# Patient Record
Sex: Male | Born: 1985 | Race: White | Hispanic: No | Marital: Married | Smoking: Never smoker
Health system: Southern US, Community
[De-identification: ages and names within clinical notes are randomized; demographics above are authoritative.]

## PROBLEM LIST (undated history)

## (undated) DIAGNOSIS — K863 Pseudocyst of pancreas: Secondary | ICD-10-CM

## (undated) DIAGNOSIS — K8020 Calculus of gallbladder without cholecystitis without obstruction: Secondary | ICD-10-CM

## (undated) HISTORY — DX: Pseudocyst of pancreas: K86.3

## (undated) HISTORY — DX: Calculus of gallbladder without cholecystitis without obstruction: K80.20

---

## 2017-09-08 ENCOUNTER — Encounter: Payer: Self-pay | Admitting: Gastroenterology

## 2017-10-30 ENCOUNTER — Ambulatory Visit: Payer: Self-pay | Admitting: Gastroenterology

## 2017-10-30 ENCOUNTER — Other Ambulatory Visit: Payer: Self-pay

## 2019-03-25 ENCOUNTER — Encounter (HOSPITAL_COMMUNITY): Payer: Self-pay | Admitting: *Deleted

## 2019-03-25 ENCOUNTER — Inpatient Hospital Stay (HOSPITAL_COMMUNITY)
Admission: EM | Admit: 2019-03-25 | Discharge: 2019-03-30 | DRG: 417 | Disposition: A | Discharge status: Home / Self Care | Payer: Commercial Managed Care - PPO | Attending: Internal Medicine | Admitting: Internal Medicine

## 2019-03-25 ENCOUNTER — Emergency Department (HOSPITAL_COMMUNITY): Payer: Commercial Managed Care - PPO

## 2019-03-25 ENCOUNTER — Other Ambulatory Visit: Payer: Self-pay

## 2019-03-25 DIAGNOSIS — R1013 Epigastric pain: Secondary | ICD-10-CM | POA: Diagnosis not present

## 2019-03-25 DIAGNOSIS — K8689 Other specified diseases of pancreas: Secondary | ICD-10-CM | POA: Diagnosis present

## 2019-03-25 DIAGNOSIS — Z6835 Body mass index (BMI) 35.0-35.9, adult: Secondary | ICD-10-CM

## 2019-03-25 DIAGNOSIS — K8020 Calculus of gallbladder without cholecystitis without obstruction: Principal | ICD-10-CM | POA: Diagnosis present

## 2019-03-25 DIAGNOSIS — Z1159 Encounter for screening for other viral diseases: Secondary | ICD-10-CM

## 2019-03-25 DIAGNOSIS — K851 Biliary acute pancreatitis without necrosis or infection: Secondary | ICD-10-CM | POA: Diagnosis present

## 2019-03-25 DIAGNOSIS — K8050 Calculus of bile duct without cholangitis or cholecystitis without obstruction: Secondary | ICD-10-CM | POA: Diagnosis present

## 2019-03-25 DIAGNOSIS — R109 Unspecified abdominal pain: Secondary | ICD-10-CM

## 2019-03-25 DIAGNOSIS — R748 Abnormal levels of other serum enzymes: Secondary | ICD-10-CM | POA: Diagnosis present

## 2019-03-25 DIAGNOSIS — Z114 Encounter for screening for human immunodeficiency virus [HIV]: Secondary | ICD-10-CM

## 2019-03-25 DIAGNOSIS — E669 Obesity, unspecified: Secondary | ICD-10-CM | POA: Diagnosis present

## 2019-03-25 LAB — COMPREHENSIVE METABOLIC PANEL
ALT: 663 U/L — ABNORMAL HIGH (ref 0–44)
AST: 281 U/L — ABNORMAL HIGH (ref 15–41)
Albumin: 4.7 g/dL (ref 3.5–5.0)
Alkaline Phosphatase: 253 U/L — ABNORMAL HIGH (ref 38–126)
Anion gap: 12 (ref 5–15)
BUN: 12 mg/dL (ref 6–20)
CO2: 27 mmol/L (ref 22–32)
Calcium: 9.7 mg/dL (ref 8.9–10.3)
Chloride: 102 mmol/L (ref 98–111)
Creatinine, Ser: 0.93 mg/dL (ref 0.61–1.24)
GFR calc Af Amer: >60 mL/min (ref >60)
GFR calc non Af Amer: >60 mL/min (ref >60)
Glucose, Bld: 183 mg/dL — ABNORMAL HIGH (ref 70–99)
Potassium: 4.4 mmol/L (ref 3.5–5.1)
Sodium: 141 mmol/L (ref 135–145)
Total Bilirubin: 5.8 mg/dL — ABNORMAL HIGH (ref 0.3–1.2)
Total Protein: 8.3 g/dL — ABNORMAL HIGH (ref 6.5–8.1)

## 2019-03-25 LAB — CBC
HCT: 47.6 % (ref 39.0–52.0)
Hemoglobin: 15.4 g/dL (ref 13.0–17.0)
MCH: 30.8 pg (ref 26.0–34.0)
MCHC: 32.4 g/dL (ref 30.0–36.0)
MCV: 95.2 fL (ref 80.0–100.0)
Platelets: 250 10*3/uL (ref 150–400)
RBC: 5 MIL/uL (ref 4.22–5.81)
RDW: 12.7 % (ref 11.5–15.5)
WBC: 15.9 10*3/uL — ABNORMAL HIGH (ref 4.0–10.5)
nRBC: 0 % (ref 0.0–0.2)

## 2019-03-25 MED ORDER — HYDROMORPHONE HCL 1 MG/ML IJ SOLN
0.5000 mg | Freq: Once | INTRAMUSCULAR | Status: AC
  Administered 2019-03-25: 0.5 mg via INTRAVENOUS
  Filled 2019-03-25: qty 1

## 2019-03-25 MED ORDER — METOCLOPRAMIDE HCL 5 MG/ML IJ SOLN
10.0000 mg | Freq: Once | INTRAMUSCULAR | Status: AC
  Administered 2019-03-25: 10 mg via INTRAVENOUS
  Filled 2019-03-25: qty 2

## 2019-03-25 MED ORDER — SODIUM CHLORIDE 0.9% FLUSH
3.0000 mL | Freq: Once | INTRAVENOUS | Status: AC
  Administered 2019-03-25: 3 mL via INTRAVENOUS

## 2019-03-25 MED ORDER — SODIUM CHLORIDE 0.9 % IV BOLUS
1000.0000 mL | Freq: Once | INTRAVENOUS | Status: AC
  Administered 2019-03-25: 1000 mL via INTRAVENOUS

## 2019-03-25 MED ORDER — HYDROMORPHONE HCL 1 MG/ML IJ SOLN
1.0000 mg | Freq: Once | INTRAMUSCULAR | Status: AC
  Administered 2019-03-25: 23:00:00 1 mg via INTRAVENOUS
  Filled 2019-03-25: qty 1

## 2019-03-25 MED ORDER — ONDANSETRON HCL 4 MG/2ML IJ SOLN
4.0000 mg | Freq: Once | INTRAMUSCULAR | Status: AC
  Administered 2019-03-25: 4 mg via INTRAVENOUS
  Filled 2019-03-25: qty 2

## 2019-03-25 NOTE — ED Triage Notes (Signed)
Pt bib EMS and presents with abd pain and has hx of gall stones.  Pt last ate at 16:00 and then at 17:00, pt began to vomit blood and diffuse abd pain. Pt is also in the process of getting worked up for gall stones.

## 2019-03-25 NOTE — ED Provider Notes (Signed)
[facility]-EMERGENCY DEPT Provider Note   CSN: [id number] Arrival date & time: 03/25/19  2048    History   Chief Complaint Chief Complaint  Patient presents with  . Abdominal Pain  . Hematemesis    HPI Benjamin Gregory is a 33 y.o. male.     Patient with no significant medical history presents with upper abdominal pain associated with nausea and vomiting. Symptoms started this afternoon. He reports this is similar to previous episodes of pain and vomiting he has been experiencing intermittently over the past 2 years. The pain is usually postprandial. It has been associated with fever in the past but no fever today. No urinary symptoms, chest pain, SOB, cough. He states his emesis today appeared to have blood.  The history is provided by the patient. No language interpreter was used.  Abdominal Pain  Associated symptoms: nausea and vomiting   Associated symptoms: no chest pain, no chills, no cough, no diarrhea, no fever and no shortness of breath     History reviewed. No pertinent past medical history.  There are no active problems to display for this patient.   History reviewed. No pertinent surgical history.      Home Medications    Prior to Admission medications   Not on File    Family History No family history on file.  Social History Social History   Tobacco Use  . Smoking status: Never Smoker  . Smokeless tobacco: Never Used  Substance Use Topics  . Alcohol use: Yes    Alcohol/week: 4.0 standard drinks    Types: 4 Standard drinks or equivalent per week  . Drug use: Never     Allergies   Patient has no known allergies.   Review of Systems Review of Systems  Constitutional: Negative for chills and fever.  HENT: Negative.   Respiratory: Negative.  Negative for cough and shortness of breath.   Cardiovascular: Negative.  Negative for chest pain.  Gastrointestinal: Positive for abdominal pain, nausea and vomiting. Negative for  diarrhea.       Hematemesis.  Genitourinary: Negative.   Musculoskeletal: Negative.  Negative for myalgias.  Skin: Negative.   Neurological: Negative.  Negative for syncope.     Physical Exam Updated Vital Signs BP 99/66 (BP Location: Left Arm)   Pulse (!) 55   Temp 98 F (36.7 C) (Oral)   Resp 18   Ht 5\' 11"  (1.803 m)   Wt 115.7 kg   SpO2 100%   BMI 35.57 kg/m   Physical Exam Vitals signs and nursing note reviewed.  Constitutional:      Appearance: He is obese. He is ill-appearing.  Cardiovascular:     Rate and Rhythm: Normal rate and regular rhythm.     Heart sounds: No murmur.  Pulmonary:     Effort: Pulmonary effort is normal.     Breath sounds: No wheezing, rhonchi or rales.  Abdominal:     Tenderness: There is abdominal tenderness. There is guarding.     Comments: Tender across upper abdomen, worst in the epigastric region. There is guarding.  Skin:    General: Skin is warm and dry.  Neurological:     General: No focal deficit present.     Mental Status: He is alert.      ED Treatments / Results  Labs (all labs ordered are listed, but only abnormal results are displayed) Labs Reviewed  COMPREHENSIVE METABOLIC PANEL - Abnormal; Notable for the following components:  Result Value   Glucose, Bld 183 (*)    Total Protein 8.3 (*)    AST 281 (*)    ALT 663 (*)    Alkaline Phosphatase 253 (*)    Total Bilirubin 5.8 (*)    All other components within normal limits  CBC - Abnormal; Notable for the following components:   WBC 15.9 (*)    All other components within normal limits  LIPASE, BLOOD  URINALYSIS, ROUTINE W REFLEX MICROSCOPIC    EKG None  Radiology No results found.  Procedures Procedures (including critical care time)  Medications Ordered in ED Medications  sodium chloride flush (NS) 0.9 % injection 3 mL (has no administration in time range)  sodium chloride 0.9 % bolus 1,000 mL (has no administration in time range)  ondansetron  (ZOFRAN) injection 4 mg (has no administration in time range)  HYDROmorphone (DILAUDID) injection 1 mg (has no administration in time range)     Initial Impression / Assessment and Plan / ED Course  I have reviewed the triage vital signs and the nursing notes.  Pertinent labs & imaging results that were available during my care of the patient were reviewed by me and considered in my medical decision making (see chart for details).       Patient to ED with abdominal pain, vomiting, being evaluated for gall stones in the outpatient setting.   On arrival he is uncomfortable and ill appearing with looking toxic. He requires multiple doses of medications to gain any relief of pain and nausea.   Labs show significantly elevated transaminases with total bili 5.3. Lipase >2600.   US shows gall stones without cholecystitis. There is dilated CBD with Caseres present. Work c/w choledocholithiasis and gall Belmares pancreatitis. Discussed with Dr. Loney Loh, [facility], who requests GI consultation.   Discussed with Dr. Adela Lank who advises that a ERCP would be done, unknown when procedure will take place. He will see the patient in the morning. [facility] to admit.   Patient updated with all information.   Final Clinical Impressions(s) / ED Diagnoses   Final diagnoses:  None   1. Choledocholithiases 2. Gall Lewers pancreatitis   ED Discharge Orders    None       Danne Harbor 03/26/19 5625    Tilden Fossa, MD 03/26/19 [phone]

## 2019-03-26 ENCOUNTER — Encounter (HOSPITAL_COMMUNITY): Payer: Self-pay | Admitting: Internal Medicine

## 2019-03-26 ENCOUNTER — Observation Stay (HOSPITAL_COMMUNITY): Payer: Commercial Managed Care - PPO

## 2019-03-26 DIAGNOSIS — K8050 Calculus of bile duct without cholangitis or cholecystitis without obstruction: Secondary | ICD-10-CM

## 2019-03-26 DIAGNOSIS — K851 Biliary acute pancreatitis without necrosis or infection: Secondary | ICD-10-CM

## 2019-03-26 DIAGNOSIS — Z114 Encounter for screening for human immunodeficiency virus [HIV]: Secondary | ICD-10-CM

## 2019-03-26 LAB — CBC WITH DIFFERENTIAL/PLATELET
Abs Immature Granulocytes: 0.06 10*3/uL (ref 0.00–0.07)
Basophils Absolute: 0 10*3/uL (ref 0.0–0.1)
Basophils Relative: 0 %
Eosinophils Absolute: 0 10*3/uL (ref 0.0–0.5)
Eosinophils Relative: 0 %
HCT: 48.5 % (ref 39.0–52.0)
Hemoglobin: 15.7 g/dL (ref 13.0–17.0)
Immature Granulocytes: 0 %
Lymphocytes Relative: 3 %
Lymphs Abs: 0.5 10*3/uL — ABNORMAL LOW (ref 0.7–4.0)
MCH: 30.8 pg (ref 26.0–34.0)
MCHC: 32.4 g/dL (ref 30.0–36.0)
MCV: 95.1 fL (ref 80.0–100.0)
Monocytes Absolute: 0.5 10*3/uL (ref 0.1–1.0)
Monocytes Relative: 3 %
Neutro Abs: 15.3 10*3/uL — ABNORMAL HIGH (ref 1.7–7.7)
Neutrophils Relative %: 94 %
Platelets: 257 10*3/uL (ref 150–400)
RBC: 5.1 MIL/uL (ref 4.22–5.81)
RDW: 12.7 % (ref 11.5–15.5)
WBC: 16.4 10*3/uL — ABNORMAL HIGH (ref 4.0–10.5)
nRBC: 0 % (ref 0.0–0.2)

## 2019-03-26 LAB — URINALYSIS, ROUTINE W REFLEX MICROSCOPIC
Glucose, UA: 50 mg/dL — AB
Hgb urine dipstick: NEGATIVE
Ketones, ur: 20 mg/dL — AB
Leukocytes,Ua: NEGATIVE
Nitrite: NEGATIVE
Protein, ur: 30 mg/dL — AB
Specific Gravity, Urine: 1.032 — ABNORMAL HIGH (ref 1.005–1.030)
pH: 5 (ref 5.0–8.0)

## 2019-03-26 LAB — LIPASE, BLOOD: Lipase: 2679 U/L — ABNORMAL HIGH (ref 11–51)

## 2019-03-26 LAB — HEPATIC FUNCTION PANEL
ALT: 575 U/L — ABNORMAL HIGH (ref 0–44)
AST: 235 U/L — ABNORMAL HIGH (ref 15–41)
Albumin: 3.9 g/dL (ref 3.5–5.0)
Alkaline Phosphatase: 230 U/L — ABNORMAL HIGH (ref 38–126)
Bilirubin, Direct: 1.9 mg/dL — ABNORMAL HIGH (ref 0.0–0.2)
Indirect Bilirubin: 1.2 mg/dL — ABNORMAL HIGH (ref 0.3–0.9)
Total Bilirubin: 3.1 mg/dL — ABNORMAL HIGH (ref 0.3–1.2)
Total Protein: 7.3 g/dL (ref 6.5–8.1)

## 2019-03-26 LAB — SARS CORONAVIRUS 2 BY RT PCR (HOSPITAL ORDER, PERFORMED IN ~~LOC~~ HOSPITAL LAB): SARS Coronavirus 2: NEGATIVE

## 2019-03-26 MED ORDER — HYDROMORPHONE HCL 1 MG/ML IJ SOLN
0.5000 mg | Freq: Once | INTRAMUSCULAR | Status: AC
  Administered 2019-03-26: 10:00:00 0.5 mg via INTRAVENOUS
  Filled 2019-03-26: qty 0.5

## 2019-03-26 MED ORDER — PIPERACILLIN-TAZOBACTAM 3.375 G IVPB
3.3750 g | Freq: Three times a day (TID) | INTRAVENOUS | Status: DC
  Administered 2019-03-26 – 2019-03-28 (×6): 3.375 g via INTRAVENOUS
  Filled 2019-03-26 (×7): qty 50

## 2019-03-26 MED ORDER — SODIUM CHLORIDE 0.9 % IV SOLN
INTRAVENOUS | Status: DC
  Administered 2019-03-26 – 2019-03-27 (×3): via INTRAVENOUS

## 2019-03-26 MED ORDER — ACETAMINOPHEN 325 MG PO TABS
650.0000 mg | ORAL_TABLET | Freq: Four times a day (QID) | ORAL | Status: DC | PRN
  Administered 2019-03-27 (×2): 650 mg via ORAL
  Filled 2019-03-26 (×2): qty 2

## 2019-03-26 MED ORDER — SODIUM CHLORIDE 0.9 % IV BOLUS: 2000.0000 mL | Freq: Once | INTRAVENOUS | Status: DC

## 2019-03-26 MED ORDER — ONDANSETRON HCL 4 MG/2ML IJ SOLN
4.0000 mg | Freq: Four times a day (QID) | INTRAMUSCULAR | Status: DC | PRN
  Administered 2019-03-26 – 2019-03-30 (×11): 4 mg via INTRAVENOUS
  Filled 2019-03-26 (×10): qty 2

## 2019-03-26 MED ORDER — HEPARIN SODIUM (PORCINE) 5000 UNIT/ML IJ SOLN: 5000.0000 [IU] | Freq: Three times a day (TID) | INTRAMUSCULAR | Status: DC

## 2019-03-26 MED ORDER — LACTATED RINGERS IV SOLN
INTRAVENOUS | Status: DC
  Administered 2019-03-26 (×2): via INTRAVENOUS

## 2019-03-26 MED ORDER — HYDROMORPHONE HCL 1 MG/ML IJ SOLN
0.5000 mg | Freq: Once | INTRAMUSCULAR | Status: AC
  Administered 2019-03-26: 0.5 mg via INTRAVENOUS
  Filled 2019-03-26: qty 0.5

## 2019-03-26 MED ORDER — HYDROMORPHONE HCL 1 MG/ML IJ SOLN
1.0000 mg | INTRAMUSCULAR | Status: DC | PRN
  Administered 2019-03-26 – 2019-03-29 (×24): 1 mg via INTRAVENOUS
  Filled 2019-03-26 (×25): qty 1

## 2019-03-26 MED ORDER — GADOBUTROL 1 MMOL/ML IV SOLN
10.0000 mL | Freq: Once | INTRAVENOUS | Status: AC | PRN
  Administered 2019-03-26: 10 mL via INTRAVENOUS

## 2019-03-26 MED ORDER — HYDROMORPHONE HCL 1 MG/ML IJ SOLN
1.0000 mg | Freq: Once | INTRAMUSCULAR | Status: AC
  Administered 2019-03-26: 01:00:00 1 mg via INTRAVENOUS
  Filled 2019-03-26: qty 1

## 2019-03-26 MED ORDER — PIPERACILLIN-TAZOBACTAM 3.375 G IVPB 30 MIN
3.3750 g | Freq: Once | INTRAVENOUS | Status: AC
  Administered 2019-03-26: 03:00:00 3.375 g via INTRAVENOUS
  Filled 2019-03-26: qty 50

## 2019-03-26 MED ORDER — METRONIDAZOLE IN NACL 5-0.79 MG/ML-% IV SOLN
500.0000 mg | Freq: Three times a day (TID) | INTRAVENOUS | Status: DC
  Administered 2019-03-26 (×2): 500 mg via INTRAVENOUS
  Filled 2019-03-26 (×2): qty 100

## 2019-03-26 MED ORDER — SODIUM CHLORIDE 0.9 % IV SOLN: 2.0000 g | INTRAVENOUS | Status: DC

## 2019-03-26 MED ORDER — SODIUM CHLORIDE 0.9 % IV SOLN
2.0000 g | Freq: Once | INTRAVENOUS | Status: AC
  Administered 2019-03-26: 2 g via INTRAVENOUS
  Filled 2019-03-26: qty 20

## 2019-03-26 MED ORDER — PANTOPRAZOLE SODIUM 40 MG IV SOLR
40.0000 mg | INTRAVENOUS | Status: DC
  Administered 2019-03-26 – 2019-03-28 (×3): 40 mg via INTRAVENOUS
  Filled 2019-03-26 (×3): qty 40

## 2019-03-26 MED ORDER — ACETAMINOPHEN 650 MG RE SUPP: 650.0000 mg | Freq: Four times a day (QID) | RECTAL | Status: DC | PRN

## 2019-03-26 NOTE — Consult Note (Signed)
Referring Provider: No ref. provider found Primary Care Physician:  Patient, No Pcp Per Primary Gastroenterologist:  Dr. Althia Forts   Reason for Consultation: gallstone pancreatitis   HPI: Benjamin Gregory is a 33 y.o. male who reports having intermittent episodes of gallstone attacks for the past 2 years. However, he has never been evaluated for gallstones. He reports having episodes of significant epigastric pain and nausea which occur after eating fatty foods approximately once every few months. These episodes occur during the day, evening and sometimes awakens him from sleep. He developed moderate epigastric pain which radiated to the mid back with associated nausea 1 week ago. He also noticed his urine was darker in color. His upper abdominal pain and nausea persisted and he contacted his PCP on Wednesday 6/3 and was prescribed Nexium and Pepto-Bismol. On Friday 6/5 he vomited bilious type secretions, second episode of vomiting was described as a cup of dark red blood with secretions and later vomited a third time consisting of secretions without obvious blood. He doesn't usually vomit with episodes of epigastric pain. He did not take Pepto-Bismol yesterday. No heartburn or dysphagia. His bowel movements have been normal formed and brown. No melena or rectal bleeding.  He denies taking NSAIDs. He drinks 4-6 beers on the weekends maybe once monthly.  He denies excessive alcohol intake.  No illicit drug use.  Family history of upper or lower GI cancer or gallstones. He intentionally lost 30 lbs over the past year with increased exercise.  ED course 03/25/2019: Sodium 141.  Potassium 4.4.  Glucose 183.  BUN 12.  Creatinine 0.93.  Alk phos 253.  Lipase 2679.  AST 281.  ALT 663.  WBC 15.9.  Hemoglobin 15.4.  Hematocrit 47.6.  Platelets 250.  SARS-CoV-2 negative.  03/26/2019: Alk phos 230.  Albumin 3.9.  AST 235.  ALT 575.  Direct bili 1.9.  Indirect bili 1.2.  Total bili 3.1.  WBC 16.4.  Hemoglobin 15.7.   Hematocrit 48.5. PLT 257.  Right upper quadrant abdominal sonogram: Cholelithiasis without signs of acute cholecystitis, dilated common bile duct suspicious for underlying choledocholithiasis, diffuse increased echogenicity with slight heterogeneous liver likely fatty infiltration. Fibrosis secondary consideration.  As of cirrhosis.  The portal vein is patent.    Prior to Admission medications   Medication Sig Start Date End Date Taking? Authorizing Provider  OVER THE COUNTER MEDICATION Take 1 tablet by mouth once.   Yes [provider]    Current Facility-Administered Medications  Medication Dose Route Frequency Provider Last Rate Last Dose  . acetaminophen (TYLENOL) tablet 650 mg  650 mg Oral Q6H PRN Shela Leff, MD       Or  . acetaminophen (TYLENOL) suppository 650 mg  650 mg Rectal Q6H PRN Shela Leff, MD      . cefTRIAXone (ROCEPHIN) 2 g in sodium chloride 0.9 % 100 mL IVPB  2 g Intravenous Q24H Shela Leff, MD      . HYDROmorphone (DILAUDID) injection 1 mg  1 mg Intravenous Q3H PRN Shela Leff, MD   1 mg at 03/26/19 0814  . lactated ringers infusion   Intravenous Continuous Shela Leff, MD 500 mL/hr at 03/26/19 [phone]    . metroNIDAZOLE (FLAGYL) IVPB 500 mg  500 mg Intravenous Q8H Shela Leff, MD 100 mL/hr at 03/26/19 0400    . ondansetron (ZOFRAN) injection 4 mg  4 mg Intravenous Q6H PRN Shela Leff, MD   4 mg at 03/26/19 5537    Allergies as of 03/25/2019  . (No  Known Allergies)    Family History  Problem Relation Age of Onset  . Hypertension Father     Social History   Socioeconomic History  . Marital status: Single    Spouse name: Not on file  . Number of children: Not on file  . Years of education: Not on file  . Highest education level: Not on file  Occupational History  . Not on file  Social Needs  . Financial resource strain: Not on file  . Food insecurity:    Worry: Not on file    Inability: Not on  file  . Transportation needs:    Medical: Not on file    Non-medical: Not on file  Tobacco Use  . Smoking status: Never Smoker  . Smokeless tobacco: Never Used  Substance and Sexual Activity  . Alcohol use: Yes    Alcohol/week: 4.0 standard drinks    Types: 4 Standard drinks or equivalent per week  . Drug use: Never  . Sexual activity: Not on file  Lifestyle  . Physical activity:    Days per week: Not on file    Minutes per session: Not on file  . Stress: Not on file  Relationships  . Social connections:    Talks on phone: Not on file    Gets together: Not on file    Attends religious service: Not on file    Active member of club or organization: Not on file    Attends meetings of clubs or organizations: Not on file    Relationship status: Not on file  . Intimate partner violence:    Fear of current or ex partner: Not on file    Emotionally abused: Not on file    Physically abused: Not on file    Forced sexual activity: Not on file  Other Topics Concern  . Not on file  Social History Narrative  . Not on file    Review of Systems: Gen: cold sweats, no fever. CV: Denies chest pain, palpitations or edema. Resp: Denies cough, shortness of breath of hemoptysis.  GI:See HPI. GU : Denies urinary burning, blood in urine, increased urinary frequency or incontinence. MS: Denies joint pain, muscles aches or weakness. Derm: Denies rash, itchiness, skin lesions or unhealing ulcers. Psych: Denies depression, anxiety, memory loss, suicidal ideation and confusion. Heme: Denies bruising, bleeding. Neuro:  Denies headaches, dizziness or paresthesias. Endo:  Denies any problems with DM, thyroid or adrenal function.  Physical Exam: Vital signs in last 24 hours: Temp:  [97.9 F (36.6 C)-98.1 F (36.7 C)] 98.1 F (36.7 C) (06/06 0326) Pulse Rate:  [51-56] 52 (06/06 0326) Resp:  [17-18] 17 (06/06 0326) BP: (99-158)/(66-86) 158/86 (06/06 0326) SpO2:  [96 %-100 %] 99 % (06/06 0326)  Weight:  [115.7 kg] 115.7 kg (06/05 2056)   General:   Alert,  well-developed, well-nourished, pleasant and cooperative in NAD. Head:  Normocephalic and atraumatic. Eyes:  Sclera clear, no icterus. onjunctiva pink. Ears:  Normal auditory acuity. Nose:  No deformity, discharge or lesions. Mouth:  No deformity or lesions.   Neck:  Supple. Lungs:  Clear throughout. Heart:  RRR. No murmur. Abdomen: soft, nondistended, moderate epigastric tenderness.  Rectal:  Deferred  Msk:  Symmetrical without gross deformities. . Pulses:  Normal pulses noted. Extremities:  Without clubbing or edema. Neurologic:  Alert and  oriented x4;  grossly normal neurologically. Skin:  Intact without significant lesions or rashes.. Psych:  Alert and cooperative. Normal mood and affect.  Intake/Output from previous  day: 06/05 0701 - 06/06 0700 In: 1255.7 [I.V.:90.1; IV Piggyback:1165.6] Out: -  Intake/Output this shift: No intake/output data recorded.  Lab Results: Recent Labs    03/25/19 2123 03/26/19 0552  WBC 15.9* 16.4*  HGB 15.4 15.7  HCT 47.6 48.5  PLT 250 257   BMET Recent Labs    03/25/19 2123  NA 141  K 4.4  CL 102  CO2 27  GLUCOSE 183*  BUN 12  CREATININE 0.93  CALCIUM 9.7   LFT Recent Labs    03/26/19 0552  PROT 7.3  ALBUMIN 3.9  AST 235*  ALT 575*  ALKPHOS 230*  BILITOT 3.1*  BILIDIR 1.9*  IBILI 1.2*   PT/INR No results for input(s): LABPROT, INR in the last 72 hours. Hepatitis Panel No results for input(s): HEPBSAG, HCVAB, HEPAIGM, HEPBIGM in the last 72 hours.    Studies/Results: US Abdomen Limited  Result Date: 03/25/2019 CLINICAL DATA:  Abdominal pain EXAM: ULTRASOUND ABDOMEN LIMITED RIGHT UPPER QUADRANT COMPARISON:  None. FINDINGS: Gallbladder: Multiple gallstones are noted. The sonographic Percell Miller sign is negative. There is no evidence of gallbladder wall thickening. There is gallbladder sludge. Common bile duct: Diameter: 0.9 cm. Liver: Diffuse increased  echogenicity with slightly heterogeneous liver. Appearance typically secondary to fatty infiltration. Fibrosis secondary consideration. No secondary findings of cirrhosis noted. No focal hepatic lesion or intrahepatic biliary duct dilatation. Portal vein is patent on color Doppler imaging with normal direction of blood flow towards the liver. IMPRESSION: 1. There is cholelithiasis without secondary signs of acute cholecystitis. 2. Dilated common bile duct suspicious for underlying choledocholithiasis. Follow-up with MRCP/ERCP is recommended. 3. Diffuse increased echogenicity with slightly heterogeneous liver. Appearance typically secondary to fatty infiltration. Fibrosis secondary consideration. No secondary findings of cirrhosis noted. No focal hepatic lesion or intrahepatic biliary duct dilatation. Electronically Signed   By: Constance Holster M.D.   On: 03/25/2019 23:55    IMPRESSION/PLAN:  65.  33 year old male with a 2 yr hx of biliary colic presents to the ED with epigastric pain, N/V found to have elevated LFTs with gallstone pancreatitis. An abdominal sonogram showed a dilated common bile duct concerning for choledocholithiasis. T. Bili 1.2. AP 230 down from 253. AST 235 down from 281. ALT 575 down from 663.  Lipase 2679. -Stat MRCP, may need ERCP  -IV fluid 500 cc/hour now -NPO -Ondansetron 4 mg IV every 6 hours PRN. -pain management per hospitalist  -Protonix 60m IV po Q 12 hrs -repeat LFTs, lipase, CBC with diff in am  2.  Leukocytosis secondary to pancreatitis, possible developing cholangitis.  WBC 16.4 up from 15.9. He is a febrile. -agree with Rocephin 2 g IV 24 hours, ? Continue Flagyl  3. MRCP vs EGD with ERCP -await recommendations from Dr .AHavery Moros to discuss ERCP with Dr. OPaulita Fujita  4. Hematemesis. Vomited approximately 1 cup of dark red blood on 6/5. No further hematemesis.   ? Mallory Weiss Tear. Started Protonix po on 6/3. No NSAID use. - to discuss EGD +/- ERCP with Dr  .APhylliss BlakesMDorathy Daft 03/26/2019, 10:41 AM

## 2019-03-26 NOTE — Progress Notes (Signed)
Pharmacy Antibiotic Note  Benjamin Gregory is a 33 y.o. male admitted on 03/25/2019 with intra-abdominal infection.  Pharmacy has been consulted for piperacillin/tazobactam dosing.  Today, 03/26/19  SCr 0.9 - WNL, CrCl > 20 mL/min  Afebrile  Plan:  Piperacillin/tazobactam 3.375 g IV q8h  Pharmacy to sign off, will follow dosing peripherally. Please reconsult if needed.   Height: 5\' 11"  (180.3 cm) Weight: 255 lb (115.7 kg) IBW/kg (Calculated) : 75.3  Temp (24hrs), Avg:98 F (36.7 C), Min:97.9 F (36.6 C), Max:98.1 F (36.7 C)  Recent Labs  Lab 03/25/19 2123 03/26/19 0552  WBC 15.9* 16.4*  CREATININE 0.93  --     Estimated Creatinine Clearance: 150.3 mL/min (by C-G formula based on SCr of 0.93 mg/dL).    No Known Allergies  Antimicrobials this admission: Pip/tazo 6/6 >>   Dose adjustments this admission:  Microbiology results: 6/5 COVID-19: Negative  Thank you for allowing pharmacy to be a part of this patient's care.  Lenis Noon, PharmD 03/26/2019 12:43 PM

## 2019-03-26 NOTE — Progress Notes (Signed)
Patient is a 33 year old male with history of cholelithiasis who presented with abdominal pain, vomiting. Patient was having on and off abdominal pain in the past.   He was having severe epigastric abdominal pain yesterday with nausea and vomiting.  When he presented to the emergency department he had elevated white cell counts, lipase, liver enzymes.  Right upper quadrant ultrasound showed cholelithiasis without signs of acute cholecystitis.  Also showed dilated common bile duct suspicious for underlying choledocholithiasis. Since he had leukocytosis, cholangitis was suspected and he was started on antibiotics which we will continue for now. General surgery, GI following.  Plan for possible ERCP as per GI.  Surgery planning for cholecystectomy in near future. Patient seen and examined the bedside this morning.  He is hemodynamically stable.  Complains of pain in the right upper quadrant and epigastric region. He has been kept n.p.o. Patient seen by Dr.Rathore this morning.

## 2019-03-26 NOTE — H&P (Addendum)
History and Physical    Davonta Stroot RCB:[id number] DOB: Jun 27, 1986 DOA: 03/25/2019  PCP: Patient, No Pcp Per Patient coming from: Home  Chief Complaint: Abdominal pain, vomiting  HPI: Benjamin Gregory is a 33 y.o. male with no significant past medical history presenting to the hospital complaining of abdominal pain and vomiting.  Patient states yesterday around 5 PM he had dinner and soon after experienced sudden onset severe epigastric abdominal pain associated with nausea and vomiting.  He describes the pain as dull and sharp and 8 out of 10 in intensity.  He has not been able to keep any food down.  States the first time he vomited there was blood mixed with food.  Subsequent episodes of vomiting have just been food.  States he has been reading about gallstones on the Internet but has never been told by any doctor that he has them.  States he drinks alcohol only occasionally, last drink was a week ago.  Denies heavy alcohol use.  ED Course: Afebrile.  Not tachycardic or tachypneic.  Not hypotensive.  White count 15.9.  AST 281, ALT 663, alk phos 253, and T bili 5.8.  Lipase 2679.  UA pending.  COVID-19 rapid test pending.  Right upper quadrant ultrasound showing cholelithiasis without signs of acute cholecystitis.  Also showing dilated common bile duct suspicious for underlying choledocholithiasis.  Patient received Dilaudid, Zofran, ceftriaxone, and 1 L IV fluid bolus in the ED.  Review of Systems:  All systems reviewed and apart from history of presenting illness, are negative.  History reviewed. No pertinent past medical history.  History reviewed. No pertinent surgical history.   reports that he has never smoked. He has never used smokeless tobacco. He reports current alcohol use of about 4.0 standard drinks of alcohol per week. He reports that he does not use drugs.  No Known Allergies  Family history reviewed.  Father has hypertension.  Prior to Admission medications   Not on File     Physical Exam: Vitals:   03/25/19 2056 03/25/19 2101 03/25/19 2348 03/26/19 0056  BP: 99/66  140/71   Pulse: (!) 55  (!) 56   Resp: 18  18   Temp: 98 F (36.7 C)   97.9 F (36.6 C)  TempSrc: Oral   Oral  SpO2: 100% 100% 96%   Weight: 115.7 kg     Height: 5' 11"  (1.803 m)       Physical Exam  Constitutional: He is oriented to person, place, and time. He appears well-developed and well-nourished. No distress.  Resting comfortably in a stretcher  HENT:  Head: Normocephalic.  Mouth/Throat: Oropharynx is clear and moist.  Eyes: Right eye exhibits no discharge. Left eye exhibits no discharge.  Neck: Neck supple.  Cardiovascular: Normal rate, regular rhythm and intact distal pulses.  Pulmonary/Chest: Effort normal and breath sounds normal. No respiratory distress. He has no wheezes. He has no rales.  Abdominal: Soft. Bowel sounds are normal. He exhibits no distension. There is abdominal tenderness. There is no rebound and no guarding.  Epigastrium mildly tender to palpation  Musculoskeletal:        General: No edema.  Neurological: He is alert and oriented to person, place, and time.  Skin: Skin is warm and dry. He is not diaphoretic.     Labs on Admission: I have personally reviewed following labs and imaging studies  CBC: Recent Labs  Lab 03/25/19 2123  WBC 15.9*  HGB 15.4  HCT 47.6  MCV 95.2  PLT 250  Basic Metabolic Panel: Recent Labs  Lab 03/25/19 2123  NA 141  K 4.4  CL 102  CO2 27  GLUCOSE 183*  BUN 12  CREATININE 0.93  CALCIUM 9.7   GFR: Estimated Creatinine Clearance: 150.3 mL/min (by C-G formula based on SCr of 0.93 mg/dL). Liver Function Tests: Recent Labs  Lab 03/25/19 2123  AST 281*  ALT 663*  ALKPHOS 253*  BILITOT 5.8*  PROT 8.3*  ALBUMIN 4.7   Recent Labs  Lab 03/25/19 2123  LIPASE 2,679*   No results for input(s): AMMONIA in the last 168 hours. Coagulation Profile: No results for input(s): INR, PROTIME in the last 168  hours. Cardiac Enzymes: No results for input(s): CKTOTAL, CKMB, CKMBINDEX, TROPONINI in the last 168 hours. BNP (last 3 results) No results for input(s): PROBNP in the last 8760 hours. HbA1C: No results for input(s): HGBA1C in the last 72 hours. CBG: No results for input(s): GLUCAP in the last 168 hours. Lipid Profile: No results for input(s): CHOL, HDL, LDLCALC, TRIG, CHOLHDL, LDLDIRECT in the last 72 hours. Thyroid Function Tests: No results for input(s): TSH, T4TOTAL, FREET4, T3FREE, THYROIDAB in the last 72 hours. Anemia Panel: No results for input(s): VITAMINB12, FOLATE, FERRITIN, TIBC, IRON, RETICCTPCT in the last 72 hours. Urine analysis: No results found for: COLORURINE, APPEARANCEUR, [place], [place], GLUCOSEU, [place], BILIRUBINUR, KETONESUR, PROTEINUR, UROBILINOGEN, NITRITE, LEUKOCYTESUR  Radiological Exams on Admission: US Abdomen Limited  Result Date: 03/25/2019 CLINICAL DATA:  Abdominal pain EXAM: ULTRASOUND ABDOMEN LIMITED RIGHT UPPER QUADRANT COMPARISON:  None. FINDINGS: Gallbladder: Multiple gallstones are noted. The sonographic Percell Miller sign is negative. There is no evidence of gallbladder wall thickening. There is gallbladder sludge. Common bile duct: Diameter: 0.9 cm. Liver: Diffuse increased echogenicity with slightly heterogeneous liver. Appearance typically secondary to fatty infiltration. Fibrosis secondary consideration. No secondary findings of cirrhosis noted. No focal hepatic lesion or intrahepatic biliary duct dilatation. Portal vein is patent on color Doppler imaging with normal direction of blood flow towards the liver. IMPRESSION: 1. There is cholelithiasis without secondary signs of acute cholecystitis. 2. Dilated common bile duct suspicious for underlying choledocholithiasis. Follow-up with MRCP/ERCP is recommended. 3. Diffuse increased echogenicity with slightly heterogeneous liver. Appearance typically secondary to fatty infiltration. Fibrosis secondary  consideration. No secondary findings of cirrhosis noted. No focal hepatic lesion or intrahepatic biliary duct dilatation. Electronically Signed   By: Constance Holster M.D.   On: 03/25/2019 23:55   Assessment/Plan Principal Problem:   Choledocholithiasis Active Problems:   Gallstone pancreatitis   Encounter for screening for HIV   Choledocholithiasis and acute gallstone pancreatitis Afebrile. White count 15.9.  No signs of sepsis.  AST 281, ALT 663, alk phos 253, and T bili 5.8.  Lipase 2679.  Right upper quadrant ultrasound showing cholelithiasis without signs of acute cholecystitis.  Also showing dilated common bile duct suspicious for underlying choledocholithiasis. -IV fluid hydration -IV Dilaudid as needed -IV Zofran PRN -Ceftriaxone and metronidazole -Keep n.p.o. -Continue to monitor CBC and LFTs -GI has been called, awaiting callback  HIV screening The patient falls between the ages of 13-64 and should be screened for HIV, therefore HIV testing ordered.  DVT prophylaxis: SCDs Code Status: Full code Family Communication: No family available at this time. Disposition Plan: Anticipate discharge after clinical improvement. Consults called: GI, awaiting callback Admission status: It is my clinical opinion that referral for OBSERVATION is reasonable and necessary in this patient based on the above information provided. The aforementioned taken together are felt to place the patient at high risk  for further clinical deterioration. However it is anticipated that the patient may be medically stable for discharge from the hospital within 24 to 48 hours.  The medical decision making on this patient was of high complexity and the patient is at high risk for clinical deterioration, therefore this is a level 3 visit.  Shela Leff MD [facility] Pager [phone]  If 7PM-7AM, please contact night-coverage [email or web address] Password [email or web address]  03/26/2019, 1:50 AM

## 2019-03-26 NOTE — Consult Note (Signed)
Reason for Consult:biliary pancreatitis Referring Physician: Dr. Tora KindredAdhikari  Benjamin Gregory is an 33 y.o. male.  HPI: Patient is a 33 year old male with no past medical history who comes in with a one-week history of abdominal pain. Patient states that he feels he has had "gallbladder attacks "over the last several years.  He was never diagnosed with gallstones.  Patient states over the last week he has had some epigastric abdominal pain is been associated with some nausea vomiting, hematemesis with associated nosebleed.  Patient states secondary to his underlying epigastric abdominal pain he presented to the ER for further evaluation and management.  Upon evaluation the ER patient underwent ultrasound.  Ultrasound revealed cholelithiasis, dilated common bile duct.  I did review these personally.  Patient also underwent laboratory studies.  This revealed a leukocytosis as well as a elevated lipase.  Secondary to the above patient was admitted and surgery was consulted for further evaluation and management.  History reviewed. No pertinent past medical history.  History reviewed. No pertinent surgical history.  Family History  Problem Relation Age of Onset  . Hypertension Father     Social History:  reports that he has never smoked. He has never used smokeless tobacco. He reports current alcohol use of about 4.0 standard drinks of alcohol per week. He reports that he does not use drugs.  Allergies: No Known Allergies  Medications: I have reviewed the patient's current medications.  Results for orders placed or performed during the hospital encounter of 03/25/19 (from the past 48 hour(s))  Lipase, blood     Status: Abnormal   Collection Time: 03/25/19  9:23 PM  Result Value Ref Range   Lipase 2,679 (H) 11 - 51 U/L    Comment: RESULTS CONFIRMED BY MANUAL DILUTION Performed at [facility], [address] [place], [place] [address]   Comprehensive metabolic panel     Status:  Abnormal   Collection Time: 03/25/19  9:23 PM  Result Value Ref Range   Sodium 141 135 - 145 mmol/L   Potassium 4.4 3.5 - 5.1 mmol/L   Chloride 102 98 - 111 mmol/L   CO2 27 22 - 32 mmol/L   Glucose, Bld 183 (H) 70 - 99 mg/dL   BUN 12 6 - 20 mg/dL   Creatinine, Ser 9.810.93 0.61 - 1.24 mg/dL   Calcium 9.7 8.9 - 19.110.3 mg/dL   Total Protein 8.3 (H) 6.5 - 8.1 g/dL   Albumin 4.7 3.5 - 5.0 g/dL   AST 478281 (H) 15 - 41 U/L   ALT 663 (H) 0 - 44 U/L   Alkaline Phosphatase 253 (H) 38 - 126 U/L   Total Bilirubin 5.8 (H) 0.3 - 1.2 mg/dL   GFR calc non Af Amer >60 >60 mL/min   GFR calc Af Amer >60 >60 mL/min   Anion gap 12 5 - 15    Comment: Performed at [facility], [address] [place], [place] [address]  CBC     Status: Abnormal   Collection Time: 03/25/19  9:23 PM  Result Value Ref Range   WBC 15.9 (H) 4.0 - 10.5 K/uL   RBC 5.00 4.22 - 5.81 MIL/uL   Hemoglobin 15.4 13.0 - 17.0 g/dL   HCT 13.047.6 86.539.0 - 78.452.0 %   MCV 95.2 80.0 - 100.0 fL   MCH 30.8 26.0 - 34.0 pg   MCHC 32.4 30.0 - 36.0 g/dL   RDW 69.612.7 29.511.5 - 28.415.5 %   Platelets 250 150 - 400  K/uL   nRBC 0.0 0.0 - 0.2 %    Comment: Performed at [facility], [place] [address] [place], [place] [address]  SARS Coronavirus 2 (CEPHEID - Performed in [facility] hospital lab), Hosp Order     Status: None   Collection Time: 03/25/19 11:50 PM  Result Value Ref Range   SARS Coronavirus 2 NEGATIVE NEGATIVE    Comment: (NOTE) If result is NEGATIVE SARS-CoV-2 target nucleic acids are NOT DETECTED. The SARS-CoV-2 RNA is generally detectable in upper and lower  respiratory specimens during the acute phase of infection. The lowest  concentration of SARS-CoV-2 viral copies this assay can detect is 250  copies / mL. A negative result does not preclude SARS-CoV-2 infection  and should not be used as the sole basis for treatment or other  patient management decisions.  A negative result may occur with  improper  specimen collection / handling, submission of specimen other  than nasopharyngeal swab, presence of viral mutation(s) within the  areas targeted by this assay, and inadequate number of viral copies  (<250 copies / mL). A negative result must be combined with clinical  observations, patient history, and epidemiological information. If result is POSITIVE SARS-CoV-2 target nucleic acids are DETECTED. The SARS-CoV-2 RNA is generally detectable in upper and lower  respiratory specimens dur ing the acute phase of infection.  Positive  results are indicative of active infection with SARS-CoV-2.  Clinical  correlation with patient history and other diagnostic information is  necessary to determine patient infection status.  Positive results do  not rule out bacterial infection or co-infection with other viruses. If result is PRESUMPTIVE POSTIVE SARS-CoV-2 nucleic acids MAY BE PRESENT.   A presumptive positive result was obtained on the submitted specimen  and confirmed on repeat testing.  While 2019 novel coronavirus  (SARS-CoV-2) nucleic acids may be present in the submitted sample  additional confirmatory testing may be necessary for epidemiological  and / or clinical management purposes  to differentiate between  SARS-CoV-2 and other Sarbecovirus currently known to infect humans.  If clinically indicated additional testing with an alternate test  methodology [phone]) is advised. The SARS-CoV-2 RNA is generally  detectable in upper and lower respiratory sp ecimens during the acute  phase of infection. The expected result is Negative. Fact Sheet for Patients:  StrictlyIdeas.no Fact Sheet for Healthcare Providers: BankingDealers.co.za This test is not yet approved or cleared by the [place] FDA and has been authorized for detection and/or diagnosis of SARS-CoV-2 by FDA under an Emergency Use Authorization (EUA).  This EUA will remain in  effect (meaning this test can be used) for the duration of the COVID-19 declaration under Section 564(b)(1) of the Act, 21 U.S.C. section 360bbb-3(b)(1), unless the authorization is terminated or revoked sooner. Performed at [facility], [place] [address] [place], [place] [address]   Urinalysis, Routine w reflex microscopic     Status: Abnormal   Collection Time: 03/26/19  5:03 AM  Result Value Ref Range   Color, Urine AMBER (A) YELLOW    Comment: BIOCHEMICALS MAY BE AFFECTED BY COLOR   APPearance CLOUDY (A) CLEAR   Specific Gravity, Urine 1.032 (H) 1.005 - 1.030   pH 5.0 5.0 - 8.0   Glucose, UA 50 (A) NEGATIVE mg/dL   Hgb urine dipstick NEGATIVE NEGATIVE   Bilirubin Urine SMALL (A) NEGATIVE   Ketones, ur 20 (A) NEGATIVE mg/dL   Protein, ur 30 (A) NEGATIVE mg/dL   Nitrite NEGATIVE NEGATIVE  Leukocytes,Ua NEGATIVE NEGATIVE   RBC / HPF 0-5 0 - 5 RBC/hpf   WBC, UA 0-5 0 - 5 WBC/hpf   Bacteria, UA MANY (A) NONE SEEN   Squamous Epithelial / LPF 0-5 0 - 5   Mucus PRESENT     Comment: Performed at [facility], [address] [place], [place] [address]  CBC with Differential/Platelet     Status: Abnormal   Collection Time: 03/26/19  5:52 AM  Result Value Ref Range   WBC 16.4 (H) 4.0 - 10.5 K/uL   RBC 5.10 4.22 - 5.81 MIL/uL   Hemoglobin 15.7 13.0 - 17.0 g/dL   HCT 60.448.5 54.039.0 - 98.152.0 %   MCV 95.1 80.0 - 100.0 fL   MCH 30.8 26.0 - 34.0 pg   MCHC 32.4 30.0 - 36.0 g/dL   RDW 19.112.7 47.811.5 - 29.515.5 %   Platelets 257 150 - 400 K/uL   nRBC 0.0 0.0 - 0.2 %   Neutrophils Relative % 94 %   Neutro Abs 15.3 (H) 1.7 - 7.7 K/uL   Lymphocytes Relative 3 %   Lymphs Abs 0.5 (L) 0.7 - 4.0 K/uL   Monocytes Relative 3 %   Monocytes Absolute 0.5 0.1 - 1.0 K/uL   Eosinophils Relative 0 %   Eosinophils Absolute 0.0 0.0 - 0.5 K/uL   Basophils Relative 0 %   Basophils Absolute 0.0 0.0 - 0.1 K/uL   Immature Granulocytes 0 %   Abs Immature Granulocytes 0.06 0.00 -  0.07 K/uL    Comment: Performed at [facility], [address] [place], [place] [address]  Hepatic function panel     Status: Abnormal   Collection Time: 03/26/19  5:52 AM  Result Value Ref Range   Total Protein 7.3 6.5 - 8.1 g/dL   Albumin 3.9 3.5 - 5.0 g/dL   AST 865235 (H) 15 - 41 U/L   ALT 575 (H) 0 - 44 U/L   Alkaline Phosphatase 230 (H) 38 - 126 U/L   Total Bilirubin 3.1 (H) 0.3 - 1.2 mg/dL   Bilirubin, Direct 1.9 (H) 0.0 - 0.2 mg/dL   Indirect Bilirubin 1.2 (H) 0.3 - 0.9 mg/dL    Comment: Performed at [facility], [address] [place], [place] [address]    [place] Abdomen Limited  Result Date: 03/25/2019 CLINICAL DATA:  Abdominal pain EXAM: ULTRASOUND ABDOMEN LIMITED RIGHT UPPER QUADRANT COMPARISON:  None. FINDINGS: Gallbladder: Multiple gallstones are noted. The sonographic Eulah PontMurphy sign is negative. There is no evidence of gallbladder wall thickening. There is gallbladder sludge. Common bile duct: Diameter: 0.9 cm. Liver: Diffuse increased echogenicity with slightly heterogeneous liver. Appearance typically secondary to fatty infiltration. Fibrosis secondary consideration. No secondary findings of cirrhosis noted. No focal hepatic lesion or intrahepatic biliary duct dilatation. Portal vein is patent on color Doppler imaging with normal direction of blood flow towards the liver. IMPRESSION: 1. There is cholelithiasis without secondary signs of acute cholecystitis. 2. Dilated common bile duct suspicious for underlying choledocholithiasis. Follow-up with MRCP/ERCP is recommended. 3. Diffuse increased echogenicity with slightly heterogeneous liver. Appearance typically secondary to fatty infiltration. Fibrosis secondary consideration. No secondary findings of cirrhosis noted. No focal hepatic lesion or intrahepatic biliary duct dilatation. Electronically Signed   By: Katherine Mantlehristopher  Green M.D.   On: 03/25/2019 23:55    Review of Systems  Constitutional:  Positive for chills. Negative for fever and malaise/fatigue.  HENT: Negative for ear discharge, hearing loss and sore throat.   Eyes: Negative  for blurred vision and discharge.  Respiratory: Negative for cough and shortness of breath.   Cardiovascular: Negative for chest pain, orthopnea and leg swelling.  Gastrointestinal: Positive for abdominal pain, nausea and vomiting. Negative for constipation, diarrhea and heartburn.  Musculoskeletal: Negative for myalgias and neck pain.  Skin: Negative for itching and rash.  Neurological: Negative for dizziness, focal weakness, seizures and loss of consciousness.  Endo/Heme/Allergies: Negative for environmental allergies. Does not bruise/bleed easily.  Psychiatric/Behavioral: Negative for depression and suicidal ideas.  All other systems reviewed and are negative.  Blood pressure (!) 158/86, pulse (!) 52, temperature 98.1 F (36.7 C), temperature source Oral, resp. rate 17, height 5\' 11"  (1.803 m), weight 115.7 kg, SpO2 99 %. Physical Exam  Constitutional: He is oriented to person, place, and time. Vital signs are normal. He appears well-developed and well-nourished.  Conversant No acute distress  Eyes: Lids are normal. No scleral icterus.  No lid lag Moist conjunctiva  Neck: No tracheal tenderness present. No thyromegaly present.  No cervical lymphadenopathy  Cardiovascular: Normal rate, regular rhythm and intact distal pulses.  No murmur heard. Respiratory: Effort normal and breath sounds normal. He has no wheezes. He has no rales.  GI: Soft. Normal appearance and bowel sounds are normal. There is no hepatosplenomegaly. There is abdominal tenderness in the epigastric area. There is no rigidity, no rebound and no guarding. No hernia.  Neurological: He is alert and oriented to person, place, and time.  Normal gait and station  Skin: Skin is warm. No rash noted. No cyanosis. Nails show no clubbing.  Normal skin turgor  Psychiatric: Judgment  normal.  Appropriate affect    Assessment/Plan: 33 year old male with biliary pancreatitis Cholelithiasis Choledocholithiasis  1.  Agree with n.p.o. status. 2.  We will continue to follow clinically and allow for the lipase and pancreatitis to cool down.  At that time we would move towards cholecystectomy. 3.  Patient to be evaluated by GI for possible ERCP secondary to possible choledocholithiasis  Benjamin Gregory 03/26/2019, 10:47 AM

## 2019-03-27 DIAGNOSIS — Z1159 Encounter for screening for other viral diseases: Secondary | ICD-10-CM | POA: Diagnosis not present

## 2019-03-27 DIAGNOSIS — K8020 Calculus of gallbladder without cholecystitis without obstruction: Secondary | ICD-10-CM | POA: Diagnosis present

## 2019-03-27 DIAGNOSIS — R945 Abnormal results of liver function studies: Secondary | ICD-10-CM | POA: Diagnosis not present

## 2019-03-27 DIAGNOSIS — E669 Obesity, unspecified: Secondary | ICD-10-CM | POA: Diagnosis present

## 2019-03-27 DIAGNOSIS — R748 Abnormal levels of other serum enzymes: Secondary | ICD-10-CM | POA: Diagnosis present

## 2019-03-27 DIAGNOSIS — K8689 Other specified diseases of pancreas: Secondary | ICD-10-CM | POA: Diagnosis present

## 2019-03-27 DIAGNOSIS — Z6835 Body mass index (BMI) 35.0-35.9, adult: Secondary | ICD-10-CM | POA: Diagnosis not present

## 2019-03-27 DIAGNOSIS — R1013 Epigastric pain: Secondary | ICD-10-CM | POA: Diagnosis present

## 2019-03-27 DIAGNOSIS — K851 Biliary acute pancreatitis without necrosis or infection: Secondary | ICD-10-CM | POA: Diagnosis present

## 2019-03-27 DIAGNOSIS — Z9049 Acquired absence of other specified parts of digestive tract: Secondary | ICD-10-CM | POA: Diagnosis not present

## 2019-03-27 DIAGNOSIS — K8050 Calculus of bile duct without cholangitis or cholecystitis without obstruction: Secondary | ICD-10-CM | POA: Diagnosis not present

## 2019-03-27 LAB — COMPREHENSIVE METABOLIC PANEL
ALT: 403 U/L — ABNORMAL HIGH (ref 0–44)
AST: 131 U/L — ABNORMAL HIGH (ref 15–41)
Albumin: 3.5 g/dL (ref 3.5–5.0)
Alkaline Phosphatase: 156 U/L — ABNORMAL HIGH (ref 38–126)
Anion gap: 9 (ref 5–15)
BUN: 11 mg/dL (ref 6–20)
CO2: 25 mmol/L (ref 22–32)
Calcium: 8.3 mg/dL — ABNORMAL LOW (ref 8.9–10.3)
Chloride: 101 mmol/L (ref 98–111)
Creatinine, Ser: 0.83 mg/dL (ref 0.61–1.24)
GFR calc Af Amer: >60 mL/min (ref >60)
GFR calc non Af Amer: >60 mL/min (ref >60)
Glucose, Bld: 122 mg/dL — ABNORMAL HIGH (ref 70–99)
Potassium: 4 mmol/L (ref 3.5–5.1)
Sodium: 135 mmol/L (ref 135–145)
Total Bilirubin: 4.1 mg/dL — ABNORMAL HIGH (ref 0.3–1.2)
Total Protein: 6.7 g/dL (ref 6.5–8.1)

## 2019-03-27 LAB — CBC WITH DIFFERENTIAL/PLATELET
Abs Immature Granulocytes: 0.18 10*3/uL — ABNORMAL HIGH (ref 0.00–0.07)
Basophils Absolute: 0.1 10*3/uL (ref 0.0–0.1)
Basophils Relative: 0 %
Eosinophils Absolute: 0 10*3/uL (ref 0.0–0.5)
Eosinophils Relative: 0 %
HCT: 51.6 % (ref 39.0–52.0)
Hemoglobin: 16.9 g/dL (ref 13.0–17.0)
Immature Granulocytes: 1 %
Lymphocytes Relative: 3 %
Lymphs Abs: 0.8 10*3/uL (ref 0.7–4.0)
MCH: 30.9 pg (ref 26.0–34.0)
MCHC: 32.8 g/dL (ref 30.0–36.0)
MCV: 94.3 fL (ref 80.0–100.0)
Monocytes Absolute: 1.2 10*3/uL — ABNORMAL HIGH (ref 0.1–1.0)
Monocytes Relative: 5 %
Neutro Abs: 22.2 10*3/uL — ABNORMAL HIGH (ref 1.7–7.7)
Neutrophils Relative %: 91 %
Platelets: 236 10*3/uL (ref 150–400)
RBC: 5.47 MIL/uL (ref 4.22–5.81)
RDW: 13 % (ref 11.5–15.5)
WBC: 24.4 10*3/uL — ABNORMAL HIGH (ref 4.0–10.5)
nRBC: 0 % (ref 0.0–0.2)

## 2019-03-27 LAB — LIPASE, BLOOD: Lipase: 361 U/L — ABNORMAL HIGH (ref 11–51)

## 2019-03-27 LAB — HIV ANTIBODY (ROUTINE TESTING W REFLEX): HIV Screen 4th Generation wRfx: NONREACTIVE

## 2019-03-27 LAB — TRIGLYCERIDES: Triglycerides: 60 mg/dL (ref <150)

## 2019-03-27 MED ORDER — PRO-STAT SUGAR FREE PO LIQD
30.0000 mL | Freq: Two times a day (BID) | ORAL | Status: DC
  Administered 2019-03-27 – 2019-03-30 (×3): 30 mL via ORAL
  Filled 2019-03-27 (×4): qty 30

## 2019-03-27 MED ORDER — ADULT MULTIVITAMIN W/MINERALS CH
1.0000 | ORAL_TABLET | Freq: Every day | ORAL | Status: DC
  Administered 2019-03-28 – 2019-03-29 (×2): 1 via ORAL
  Filled 2019-03-27 (×2): qty 1

## 2019-03-27 MED ORDER — BOOST / RESOURCE BREEZE PO LIQD CUSTOM
1.0000 | Freq: Two times a day (BID) | ORAL | Status: DC
  Administered 2019-03-29 – 2019-03-30 (×2): 1 via ORAL

## 2019-03-27 NOTE — Progress Notes (Signed)
[facility] Progress Note  CC:  Gallstone pancreatitis   Subjective: He reports having less abdominal pain today. Slight nausea, no vomiting. Remains NPO.    Objective:  Vital signs in last 24 hours: Temp:  [98.6 F (37 C)-99.3 F (37.4 C)] 99.1 F (37.3 C) (06/07 0517) Pulse Rate:  [76-130] 103 (06/07 0520) Resp:  [18-19] 19 (06/07 0517) BP: (124-139)/(83-90) 124/83 (06/07 0517) SpO2:  [94 %-96 %] 94 % (06/07 0517)   General:   Alert,  Well-developed, in NAD Heart: tachycardic, no murmur. Pulm: Lungs clear throughout, no wheezes, rhonchi or crackles. Abdomen: Soft, moderate upper abdominal  And periumbilical tenderness without rebound or guarding, trace tenderness to the lower abdomen, + BS x 4 quads.  Extremities:  Without edema. Neurologic:  Alert and  oriented x4;  grossly normal neurologically. Psych:  Alert and cooperative. Normal mood and affect.  Intake/Output from previous day: 06/06 0701 - 06/07 0700 In: 1685.6 [I.V.:1580.6; IV Piggyback:105] Out: -  Intake/Output this shift: No intake/output data recorded.  Lab Results: Recent Labs    03/25/19 2123 03/26/19 0552 03/27/19 0603  WBC 15.9* 16.4* PENDING  HGB 15.4 15.7 16.9  HCT 47.6 48.5 51.6  PLT 250 257 236   BMET Recent Labs    03/25/19 2123 03/27/19 0603  NA 141 135  K 4.4 4.0  CL 102 101  CO2 27 25  GLUCOSE 183* 122*  BUN 12 11  CREATININE 0.93 0.83  CALCIUM 9.7 8.3*   LFT Recent Labs    03/26/19 0552 03/27/19 0603  PROT 7.3 6.7  ALBUMIN 3.9 3.5  AST 235* 131*  ALT 575* 403*  ALKPHOS 230* 156*  BILITOT 3.1* 4.1*  BILIDIR 1.9*  --   IBILI 1.2*  --    PT/INR No results for input(s): LABPROT, INR in the last 72 hours. Hepatitis Panel No results for input(s): HEPBSAG, HCVAB, HEPAIGM, HEPBIGM in the last 72 hours.  Mr 3d Recon At Scanner  Result Date: 03/26/2019 CLINICAL DATA:  Pancreatitis. Rule out choledocholithiasis. Abdominal pain and vomiting. EXAM: MRI  ABDOMEN WITHOUT AND WITH CONTRAST (INCLUDING MRCP) TECHNIQUE: Multiplanar multisequence MR imaging of the abdomen was performed both before and after the administration of intravenous contrast. Heavily T2-weighted images of the biliary and pancreatic ducts were obtained, and three-dimensional MRCP images were rendered by post processing. CONTRAST:  10 cc Gadavist COMPARISON:  Ultrasound of 03/25/2019 FINDINGS: Lower chest: Trace left pleural fluid is likely physiologic. Normal heart size. Hepatobiliary: Normal liver. Multiple gallstones on the order of 3-4 mm. No intra or extrahepatic biliary duct dilatation. The common duct is normal in caliber, on the order of 4-5 mm. No choledocholithiasis. Pancreas: Marked pancreatic and peripancreatic edema with fluid throughout the upper abdomen. Pancreatic necrosis involving the body, including on image 51/1103 and 47/11102. No well-circumscribed peripancreatic fluid collection. Spleen:  Normal in size, without focal abnormality. Adrenals/Urinary Tract: Normal adrenal glands. Normal kidneys, without hydronephrosis. Stomach/Bowel: Normal stomach. The descending duodenum is thick walled, including on 64/1100 3. Vascular/Lymphatic: Normal caliber of the aorta and branch vessels. Patent portal and splenic veins. No abdominal adenopathy. Other:  Small volume intraperitoneal fluid, likely secondary. Musculoskeletal: No acute osseous abnormality. IMPRESSION: 1. Cholelithiasis, but no biliary duct dilatation or evidence of choledocholithiasis. 2. 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 3. Duodenal wall thickening, likely related to secondary duodenitis. Electronically Signed   By: Abigail Miyamoto M.D.   On: 03/26/2019 14:42   US Abdomen Limited  Result Date: 03/25/2019  CLINICAL DATA:  Abdominal pain EXAM: ULTRASOUND ABDOMEN LIMITED RIGHT UPPER QUADRANT COMPARISON:  None. FINDINGS: Gallbladder: Multiple gallstones are noted. The sonographic Murphy sign is  negative. There is no evidence of gallbladder wall thickening. There is gallbladder sludge. Common bile duct: Diameter: 0.9 cm. Liver: Diffuse increased echogenicity with slightly heterogeneous liver. Appearance typically secondary to fatty infiltration. Fibrosis secondary consideration. No secondary findings of cirrhosis noted. No focal hepatic lesion or intrahepatic biliary duct dilatation. Portal vein is patent on color Doppler imaging with normal direction of blood flow towards the liver. IMPRESSION: 1. There is cholelithiasis without secondary signs of acute cholecystitis. 2. Dilated common bile duct suspicious for underlying choledocholithiasis. Follow-up with MRCP/ERCP is recommended. 3. Diffuse increased echogenicity with slightly heterogeneous liver. Appearance typically secondary to fatty infiltration. Fibrosis secondary consideration. No secondary findings of cirrhosis noted. No focal hepatic lesion or intrahepatic biliary duct dilatation. Electronically Signed   By: Christopher  Green M.D.   On: 03/25/2019 23:55   Mr Abdomen Mrcp W Wo Contast  Result Date: 03/26/2019 CLINICAL DATA:  Pancreatitis. Rule out choledocholithiasis. Abdominal pain and vomiting. EXAM: MRI ABDOMEN WITHOUT AND WITH CONTRAST (INCLUDING MRCP) TECHNIQUE: Multiplanar multisequence MR imaging of the abdomen was performed both before and after the administration of intravenous contrast. Heavily T2-weighted images of the biliary and pancreatic ducts were obtained, and three-dimensional MRCP images were rendered by post processing. CONTRAST:  10 cc Gadavist COMPARISON:  Ultrasound of 03/25/2019 FINDINGS: Lower chest: Trace left pleural fluid is likely physiologic. Normal heart size. Hepatobiliary: Normal liver. Multiple gallstones on the order of 3-4 mm. No intra or extrahepatic biliary duct dilatation. The common duct is normal in caliber, on the order of 4-5 mm. No choledocholithiasis. Pancreas: Marked pancreatic and peripancreatic  edema with fluid throughout the upper abdomen. Pancreatic necrosis involving the body, including on image 51/1103 and 47/11102. No well-circumscribed peripancreatic fluid collection. Spleen:  Normal in size, without focal abnormality. Adrenals/Urinary Tract: Normal adrenal glands. Normal kidneys, without hydronephrosis. Stomach/Bowel: Normal stomach. The descending duodenum is thick walled, including on 64/1100 3. Vascular/Lymphatic: Normal caliber of the aorta and branch vessels. Patent portal and splenic veins. No abdominal adenopathy. Other:  Small volume intraperitoneal fluid, likely secondary. Musculoskeletal: No acute osseous abnormality. IMPRESSION: 1. Cholelithiasis, but no biliary duct dilatation or evidence of choledocholithiasis. 2. 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 3. Duodenal wall thickening, likely related to secondary duodenitis. Electronically Signed   By: Kyle  Talbot M.D.   On: 03/26/2019 14:42    Assessment / Plan:  1. 32 y.o. male with significant gallstone pancreatitis. Lipase 2,679 on admission.Today Lipase 361.  AST 131 <<235. ALT 403<< 575.  T. Bili 4.1 >>3.1. Triglycerides 60. Today's WBC pending, WBC  16.4 6/6.  RUQ sono showed cholelithiasis, gallbladder sludge without acute cholecystitis, dilated CBD [id number] suspicious for choledocholethiasis. Abdominal MRI/MRCP w/wo contrast 6/6 identified cholelithiasis without biliary dilatation or choledocholithiasis, market pancreatitis with pancreatic necrosis involving the body and duodenal wall thickening most likely secondary to pancreatitis. A well circumscribed peripancreatic fluid collection. He remains afebrile -NPO with sips of water ok -continue IVF at 150cc/hr -await WBC result today, may not require IV antibiotics as MRCP did not show choledocholithiasis, unlikely cholangitis.  -surgery following, discussed Lap Chole with IOC when pancreatitis improved. -pain managaement per hospitalist  -Ondansetron  4mg  IV Q 6hrs PRN  2. No further hematemesis -continue PPI -monitor for signs of GI bleeding -monitor H/H  Further recommendations per Dr. Armbruster     LOS: 0 days  Benjamin Gregory  03/27/2019, 9:44 AM

## 2019-03-27 NOTE — Progress Notes (Signed)
Subjective/Chief Complaint: Still with moderate epigastric and RUQ abdominal pain, but improved   Objective: Vital signs in last 24 hours: Temp:  [98.6 F (37 C)-99.3 F (37.4 C)] 99.1 F (37.3 C) (06/07 0517) Pulse Rate:  [76-130] 103 (06/07 0520) Resp:  [18-19] 19 (06/07 0517) BP: (124-139)/(83-90) 124/83 (06/07 0517) SpO2:  [94 %-96 %] 94 % (06/07 0517)    Intake/Output from previous day: 06/06 0701 - 06/07 0700 In: 1685.6 [I.V.:1580.6; IV Piggyback:105] Out: -  Intake/Output this shift: No intake/output data recorded.  Exam: Awake and alert Abdomen soft with moderate epigastric guarding  Lab Results:  Recent Labs    03/26/19 0552 03/27/19 0603  WBC 16.4* PENDING  HGB 15.7 16.9  HCT 48.5 51.6  PLT 257 236   BMET Recent Labs    03/25/19 2123  NA 141  K 4.4  CL 102  CO2 27  GLUCOSE 183*  BUN 12  CREATININE 0.93  CALCIUM 9.7   PT/INR No results for input(s): LABPROT, INR in the last 72 hours. ABG No results for input(s): PHART, HCO3 in the last 72 hours.  Invalid input(s): PCO2, PO2  Studies/Results: Mr 3d Recon At Scanner  Result Date: 03/26/2019 CLINICAL DATA:  Pancreatitis. Rule out choledocholithiasis. Abdominal pain and vomiting. EXAM: MRI ABDOMEN WITHOUT AND WITH CONTRAST (INCLUDING MRCP) TECHNIQUE: Multiplanar multisequence MR imaging of the abdomen was performed both before and after the administration of intravenous contrast. Heavily T2-weighted images of the biliary and pancreatic ducts were obtained, and three-dimensional MRCP images were rendered by post processing. CONTRAST:  10 cc Gadavist COMPARISON:  Ultrasound of 03/25/2019 FINDINGS: Lower chest: Trace left pleural fluid is likely physiologic. Normal heart size. Hepatobiliary: Normal liver. Multiple gallstones on the order of 3-4 mm. No intra or extrahepatic biliary duct dilatation. The common duct is normal in caliber, on the order of 4-5 mm. No choledocholithiasis. Pancreas: Marked  pancreatic and peripancreatic edema with fluid throughout the upper abdomen. Pancreatic necrosis involving the body, including on image 51/1103 and 47/11102. No well-circumscribed peripancreatic fluid collection. Spleen:  Normal in size, without focal abnormality. Adrenals/Urinary Tract: Normal adrenal glands. Normal kidneys, without hydronephrosis. Stomach/Bowel: Normal stomach. The descending duodenum is thick walled, including on 64/1100 3. Vascular/Lymphatic: Normal caliber of the aorta and branch vessels. Patent portal and splenic veins. No abdominal adenopathy. Other:  Small volume intraperitoneal fluid, likely secondary. Musculoskeletal: No acute osseous abnormality. IMPRESSION: 1. Cholelithiasis, but no biliary duct dilatation or evidence of choledocholithiasis. 2. 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 3. Duodenal wall thickening, likely related to secondary duodenitis. Electronically Signed   By: Abigail Miyamoto M.D.   On: 03/26/2019 14:42   US Abdomen Limited  Result Date: 03/25/2019 CLINICAL DATA:  Abdominal pain EXAM: ULTRASOUND ABDOMEN LIMITED RIGHT UPPER QUADRANT COMPARISON:  None. FINDINGS: Gallbladder: Multiple gallstones are noted. The sonographic Percell Miller sign is negative. There is no evidence of gallbladder wall thickening. There is gallbladder sludge. Common bile duct: Diameter: 0.9 cm. Liver: Diffuse increased echogenicity with slightly heterogeneous liver. Appearance typically secondary to fatty infiltration. Fibrosis secondary consideration. No secondary findings of cirrhosis noted. No focal hepatic lesion or intrahepatic biliary duct dilatation. Portal vein is patent on color Doppler imaging with normal direction of blood flow towards the liver. IMPRESSION: 1. There is cholelithiasis without secondary signs of acute cholecystitis. 2. Dilated common bile duct suspicious for underlying choledocholithiasis. Follow-up with MRCP/ERCP is recommended. 3. Diffuse increased  echogenicity with slightly heterogeneous liver. Appearance typically secondary to fatty infiltration. Fibrosis secondary  consideration. No secondary findings of cirrhosis noted. No focal hepatic lesion or intrahepatic biliary duct dilatation. Electronically Signed   By: Katherine Mantlehristopher  Green M.D.   On: 03/25/2019 23:55   Mr Abdomen Mrcp Vivien RossettiW Wo Contast  Result Date: 03/26/2019 CLINICAL DATA:  Pancreatitis. Rule out choledocholithiasis. Abdominal pain and vomiting. EXAM: MRI ABDOMEN WITHOUT AND WITH CONTRAST (INCLUDING MRCP) TECHNIQUE: Multiplanar multisequence MR imaging of the abdomen was performed both before and after the administration of intravenous contrast. Heavily T2-weighted images of the biliary and pancreatic ducts were obtained, and three-dimensional MRCP images were rendered by post processing. CONTRAST:  10 cc Gadavist COMPARISON:  Ultrasound of 03/25/2019 FINDINGS: Lower chest: Trace left pleural fluid is likely physiologic. Normal heart size. Hepatobiliary: Normal liver. Multiple gallstones on the order of 3-4 mm. No intra or extrahepatic biliary duct dilatation. The common duct is normal in caliber, on the order of 4-5 mm. No choledocholithiasis. Pancreas: Marked pancreatic and peripancreatic edema with fluid throughout the upper abdomen. Pancreatic necrosis involving the body, including on image 51/1103 and 47/11102. No well-circumscribed peripancreatic fluid collection. Spleen:  Normal in size, without focal abnormality. Adrenals/Urinary Tract: Normal adrenal glands. Normal kidneys, without hydronephrosis. Stomach/Bowel: Normal stomach. The descending duodenum is thick walled, including on 64/1100 3. Vascular/Lymphatic: Normal caliber of the aorta and branch vessels. Patent portal and splenic veins. No abdominal adenopathy. Other:  Small volume intraperitoneal fluid, likely secondary. Musculoskeletal: No acute osseous abnormality. IMPRESSION: 1. Cholelithiasis, but no biliary duct dilatation or  evidence of choledocholithiasis. 2. 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 3. Duodenal wall thickening, likely related to secondary duodenitis. Electronically Signed   By: Jeronimo GreavesKyle  Talbot M.D.   On: 03/26/2019 14:42    Anti-infectives: Anti-infectives (From admission, onward)   Start     Dose/Rate Route Frequency Ordered Stop   03/26/19 2200  cefTRIAXone (ROCEPHIN) 2 g in sodium chloride 0.9 % 100 mL IVPB  Status:  Discontinued     2 g 200 mL/hr over 30 Minutes Intravenous Every 24 hours 03/26/19 0125 03/26/19 1220   03/26/19 1400  piperacillin-tazobactam (ZOSYN) IVPB 3.375 g     3.375 g 12.5 mL/hr over 240 Minutes Intravenous Every 8 hours 03/26/19 1242     03/26/19 0145  metroNIDAZOLE (FLAGYL) IVPB 500 mg  Status:  Discontinued     500 mg 100 mL/hr over 60 Minutes Intravenous Every 8 hours 03/26/19 0125 03/26/19 1220   03/26/19 0145  piperacillin-tazobactam (ZOSYN) IVPB 3.375 g     3.375 g 100 mL/hr over 30 Minutes Intravenous  Once 03/26/19 0132 03/26/19 0333   03/26/19 0030  cefTRIAXone (ROCEPHIN) 2 g in sodium chloride 0.9 % 100 mL IVPB     2 g 200 mL/hr over 30 Minutes Intravenous  Once 03/26/19 0020 03/26/19 0204      Assessment/Plan: Gallstone pancreatitis  MRCP with significant pancreatic and peripancreatic inflammation with possible necrosis, no definite CBD [place] seen.  Labs pending Continue IV rehydration and bowel rest Lap chole with IOC when pancreatitis improved   LOS: 0 days    Abigail Miyamotoouglas Jhett Fretwell 03/27/2019

## 2019-03-27 NOTE — Progress Notes (Addendum)
PROGRESS NOTE    Benjamin Gregory  URK:[id number] DOB: [birth year] DOA: 03/25/2019 PCP: Patient, No Pcp Per   Brief Narrative: Patient is a 33 year old male with history of cholelithiasis who presented with abdominal pain, vomiting. Patient was having on and off abdominal pain in the past.   He was having severe epigastric abdominal pain yesterday with nausea and vomiting.  When he presented to the emergency department he had elevated white cell counts, lipase, liver enzymes.  Right upper quadrant ultrasound showed cholelithiasis without signs of acute cholecystitis.  Also showed dilated common bile duct suspicious for underlying choledocholithiasis. 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 GI and general surgery following  Assessment & Plan:   Principal Problem:   Choledocholithiasis Active Problems:   Gall Winzer pancreatitis   Encounter for screening for HIV   Severe pancreatitis with pancreatic necrosis: MRCP as above.  General surgery and GI following.  Continue IV fluids.  Continue n.p.o.  Continue pain management.  Liver enzymes, lipase have improved. Continue IV antibioticsbecause of pancreatic necrosis.  Might need to change antibiotics to meropenem if necessary, will wait for GI recommendation.  Elevated white cell counts today.Calicum has decreased. Patient says his abdomen pain has improved.  Still has significant tenderness in the epigastric region.  Has not passed any flatus, does not have a bowel movement yet.  Cholelithiasis: Eventual plan for cholecystectomy as per general surgery.         DVT prophylaxis:SCDs Code Status: Full Family Communication: None present at the bedside Disposition Plan: Home after complete resolution of pancreatitis   Consultants: GI, general surgery  Procedures: MRCP  Antimicrobials:  Anti-infectives (From admission,  onward)   Start     Dose/Rate Route Frequency Ordered Stop   03/26/19 2200  cefTRIAXone (ROCEPHIN) 2 g in sodium chloride 0.9 % 100 mL IVPB  Status:  Discontinued     2 g 200 mL/hr over 30 Minutes Intravenous Every 24 hours 03/26/19 0125 03/26/19 1220   03/26/19 1400  piperacillin-tazobactam (ZOSYN) IVPB 3.375 g     3.375 g 12.5 mL/hr over 240 Minutes Intravenous Every 8 hours 03/26/19 1242     03/26/19 0145  metroNIDAZOLE (FLAGYL) IVPB 500 mg  Status:  Discontinued     500 mg 100 mL/hr over 60 Minutes Intravenous Every 8 hours 03/26/19 0125 03/26/19 1220   03/26/19 0145  piperacillin-tazobactam (ZOSYN) IVPB 3.375 g     3.375 g 100 mL/hr over 30 Minutes Intravenous  Once 03/26/19 0132 03/26/19 0333   03/26/19 0030  cefTRIAXone (ROCEPHIN) 2 g in sodium chloride 0.9 % 100 mL IVPB     2 g 200 mL/hr over 30 Minutes Intravenous  Once 03/26/19 0020 03/26/19 0204      Subjective: Patient seen and examined the bedside this morning.  Hemodynamically stable.  Pain is better today.  Has not passed any flatus, no bowel movement.  Objective: Vitals:   03/26/19 2153 03/26/19 2156 03/27/19 0517 03/27/19 0520  BP: 135/90  124/83   Pulse: (!) 130 (!) 101 (!) 130 (!) 103  Resp: 19  19   Temp: 98.6 F (37 C)  99.1 F (37.3 C)   TempSrc: Oral  Oral   SpO2: 94%  94%   Weight:      Height:        Intake/Output Summary (Last 24 hours) at 03/27/2019 1323 Last data filed at 03/27/2019 0300 Gross per 24 hour  Intake  1685.61 ml  Output --  Net 1685.61 ml   Filed Weights   03/25/19 2056  Weight: 115.7 kg    Examination:  General exam: Not in distress,average built,obese HEENT:PERRL,Oral mucosa moist, Ear/Nose normal on gross exam Respiratory system: Bilateral equal air entry, normal vesicular breath sounds, no wheezes or crackles  Cardiovascular system: S1 & S2 heard, RRR. No JVD, murmurs, rubs, gallops or clicks. No pedal edema. Gastrointestinal system: Abdomen is distended, soft.   Tenderness on the epigastric region.  No bowel sounds heard. Central nervous system: Alert and oriented. No focal neurological deficits. Extremities: No edema, no clubbing ,no cyanosis, distal peripheral pulses palpable. Skin: No rashes, lesions or ulcers,no icterus ,no pallor MSK: Normal muscle bulk,tone ,power Psychiatry: Judgement and insight appear normal. Mood & affect appropriate.     Data Reviewed: I have personally reviewed following labs and imaging studies  CBC: Recent Labs  Lab 03/25/19 2123 03/26/19 0552 03/27/19 0603  WBC 15.9* 16.4* 24.4*  NEUTROABS  --  15.3* 22.2*  HGB 15.4 15.7 16.9  HCT 47.6 48.5 51.6  MCV 95.2 95.1 94.3  PLT 250 257 236   Basic Metabolic Panel: Recent Labs  Lab 03/25/19 2123 03/27/19 0603  NA 141 135  K 4.4 4.0  CL 102 101  CO2 27 25  GLUCOSE 183* 122*  BUN 12 11  CREATININE 0.93 0.83  CALCIUM 9.7 8.3*   GFR: Estimated Creatinine Clearance: 168.4 mL/min (by C-G formula based on SCr of 0.83 mg/dL). Liver Function Tests: Recent Labs  Lab 03/25/19 2123 03/26/19 0552 03/27/19 0603  AST 281* 235* 131*  ALT 663* 575* 403*  ALKPHOS 253* 230* 156*  BILITOT 5.8* 3.1* 4.1*  PROT 8.3* 7.3 6.7  ALBUMIN 4.7 3.9 3.5   Recent Labs  Lab 03/25/19 2123 03/27/19 0603  LIPASE 2,679* 361*   No results for input(s): AMMONIA in the last 168 hours. Coagulation Profile: No results for input(s): INR, PROTIME in the last 168 hours. Cardiac Enzymes: No results for input(s): CKTOTAL, CKMB, CKMBINDEX, TROPONINI in the last 168 hours. BNP (last 3 results) No results for input(s): PROBNP in the last 8760 hours. HbA1C: No results for input(s): HGBA1C in the last 72 hours. CBG: No results for input(s): GLUCAP in the last 168 hours. Lipid Profile: Recent Labs    03/27/19 0603  TRIG 60   Thyroid Function Tests: No results for input(s): TSH, T4TOTAL, FREET4, T3FREE, THYROIDAB in the last 72 hours. Anemia Panel: No results for input(s):  VITAMINB12, FOLATE, FERRITIN, TIBC, IRON, RETICCTPCT in the last 72 hours. Sepsis Labs: No results for input(s): PROCALCITON, LATICACIDVEN in the last 168 hours.  Recent Results (from the past 240 hour(s))  SARS Coronavirus 2 (CEPHEID - Performed in [facility] lab), Hosp Order     Status: None   Collection Time: 03/25/19 11:50 PM  Result Value Ref Range Status   SARS Coronavirus 2 NEGATIVE NEGATIVE Final    Comment: (NOTE) If result is NEGATIVE SARS-CoV-2 target nucleic acids are NOT DETECTED. The SARS-CoV-2 RNA is generally detectable in upper and lower  respiratory specimens during the acute phase of infection. The lowest  concentration of SARS-CoV-2 viral copies this assay can detect is 250  copies / mL. A negative result does not preclude SARS-CoV-2 infection  and should not be used as the sole basis for treatment or other  patient management decisions.  A negative result may occur with  improper specimen collection / handling, submission of specimen other  than nasopharyngeal swab,  presence of viral mutation(s) within the  areas targeted by this assay, and inadequate number of viral copies  (<250 copies / mL). A negative result must be combined with clinical  observations, patient history, and epidemiological information. If result is POSITIVE SARS-CoV-2 target nucleic acids are DETECTED. The SARS-CoV-2 RNA is generally detectable in upper and lower  respiratory specimens dur ing the acute phase of infection.  Positive  results are indicative of active infection with SARS-CoV-2.  Clinical  correlation with patient history and other diagnostic information is  necessary to determine patient infection status.  Positive results do  not rule out bacterial infection or co-infection with other viruses. If result is PRESUMPTIVE POSTIVE SARS-CoV-2 nucleic acids MAY BE PRESENT.   A presumptive positive result was obtained on the submitted specimen  and confirmed on repeat  testing.  While 2019 novel coronavirus  (SARS-CoV-2) nucleic acids may be present in the submitted sample  additional confirmatory testing may be necessary for epidemiological  and / or clinical management purposes  to differentiate between  SARS-CoV-2 and other Sarbecovirus currently known to infect humans.  If clinically indicated additional testing with an alternate test  methodology [phone]([id number]) is advised. The SARS-CoV-2 RNA is generally  detectable in upper and lower respiratory sp ecimens during the acute  phase of infection. The expected result is Negative. Fact Sheet for Patients:  BoilerBrush.com.cyhttps://www.fda.gov/media/136312/download Fact Sheet for Healthcare Providers: https://pope.com/https://www.fda.gov/media/136313/download This test is not yet approved or cleared by the [place] FDA and has been authorized for detection and/or diagnosis of SARS-CoV-2 by FDA under an Emergency Use Authorization (EUA).  This EUA will remain in effect (meaning this test can be used) for the duration of the COVID-19 declaration under Section 564(b)(1) of the Act, 21 U.S.C. section 360bbb-3(b)(1), unless the authorization is terminated or revoked sooner. Performed at [facility], [address] [place], [place] [address]          Radiology Studies: Mr 3d Recon At Scanner  Result Date: 03/26/2019 CLINICAL DATA:  Pancreatitis. Rule out choledocholithiasis. Abdominal pain and vomiting. EXAM: MRI ABDOMEN WITHOUT AND WITH CONTRAST (INCLUDING MRCP) TECHNIQUE: Multiplanar multisequence MR imaging of the abdomen was performed both before and after the administration of intravenous contrast. Heavily T2-weighted images of the biliary and pancreatic ducts were obtained, and three-dimensional MRCP images were rendered by post processing. CONTRAST:  10 cc Gadavist COMPARISON:  Ultrasound of 03/25/2019 FINDINGS: Lower chest: Trace left pleural fluid is likely physiologic. Normal heart size. Hepatobiliary:  Normal liver. Multiple gallstones on the order of 3-4 mm. No intra or extrahepatic biliary duct dilatation. The common duct is normal in caliber, on the order of 4-5 mm. No choledocholithiasis. Pancreas: Marked pancreatic and peripancreatic edema with fluid throughout the upper abdomen. Pancreatic necrosis involving the body, including on image 51/1103 and 47/11102. No well-circumscribed peripancreatic fluid collection. Spleen:  Normal in size, without focal abnormality. Adrenals/Urinary Tract: Normal adrenal glands. Normal kidneys, without hydronephrosis. Stomach/Bowel: Normal stomach. The descending duodenum is thick walled, including on 64/1100 3. Vascular/Lymphatic: Normal caliber of the aorta and branch vessels. Patent portal and splenic veins. No abdominal adenopathy. Other:  Small volume intraperitoneal fluid, likely secondary. Musculoskeletal: No acute osseous abnormality. IMPRESSION: 1. Cholelithiasis, but no biliary duct dilatation or evidence of choledocholithiasis. 2. 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 3. Duodenal wall thickening, likely related to secondary duodenitis. Electronically Signed   By: Jeronimo GreavesKyle  Talbot M.D.   On: 03/26/2019 14:42   [place] Abdomen Limited  Result Date:  03/25/2019 CLINICAL DATA:  Abdominal pain EXAM: ULTRASOUND ABDOMEN LIMITED RIGHT UPPER QUADRANT COMPARISON:  None. FINDINGS: Gallbladder: Multiple gallstones are noted. The sonographic Eulah PontMurphy sign is negative. There is no evidence of gallbladder wall thickening. There is gallbladder sludge. Common bile duct: Diameter: 0.9 cm. Liver: Diffuse increased echogenicity with slightly heterogeneous liver. Appearance typically secondary to fatty infiltration. Fibrosis secondary consideration. No secondary findings of cirrhosis noted. No focal hepatic lesion or intrahepatic biliary duct dilatation. Portal vein is patent on color Doppler imaging with normal direction of blood flow towards the liver. IMPRESSION: 1.  There is cholelithiasis without secondary signs of acute cholecystitis. 2. Dilated common bile duct suspicious for underlying choledocholithiasis. Follow-up with MRCP/ERCP is recommended. 3. Diffuse increased echogenicity with slightly heterogeneous liver. Appearance typically secondary to fatty infiltration. Fibrosis secondary consideration. No secondary findings of cirrhosis noted. No focal hepatic lesion or intrahepatic biliary duct dilatation. Electronically Signed   By: Katherine Mantlehristopher  Green M.D.   On: 03/25/2019 23:55   Mr Abdomen Mrcp Vivien RossettiW Wo Contast  Result Date: 03/26/2019 CLINICAL DATA:  Pancreatitis. Rule out choledocholithiasis. Abdominal pain and vomiting. EXAM: MRI ABDOMEN WITHOUT AND WITH CONTRAST (INCLUDING MRCP) TECHNIQUE: Multiplanar multisequence MR imaging of the abdomen was performed both before and after the administration of intravenous contrast. Heavily T2-weighted images of the biliary and pancreatic ducts were obtained, and three-dimensional MRCP images were rendered by post processing. CONTRAST:  10 cc Gadavist COMPARISON:  Ultrasound of 03/25/2019 FINDINGS: Lower chest: Trace left pleural fluid is likely physiologic. Normal heart size. Hepatobiliary: Normal liver. Multiple gallstones on the order of 3-4 mm. No intra or extrahepatic biliary duct dilatation. The common duct is normal in caliber, on the order of 4-5 mm. No choledocholithiasis. Pancreas: Marked pancreatic and peripancreatic edema with fluid throughout the upper abdomen. Pancreatic necrosis involving the body, including on image 51/1103 and 47/11102. No well-circumscribed peripancreatic fluid collection. Spleen:  Normal in size, without focal abnormality. Adrenals/Urinary Tract: Normal adrenal glands. Normal kidneys, without hydronephrosis. Stomach/Bowel: Normal stomach. The descending duodenum is thick walled, including on 64/1100 3. Vascular/Lymphatic: Normal caliber of the aorta and branch vessels. Patent portal and splenic  veins. No abdominal adenopathy. Other:  Small volume intraperitoneal fluid, likely secondary. Musculoskeletal: No acute osseous abnormality. IMPRESSION: 1. Cholelithiasis, but no biliary duct dilatation or evidence of choledocholithiasis. 2. 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 3. Duodenal wall thickening, likely related to secondary duodenitis. Electronically Signed   By: Jeronimo GreavesKyle  Talbot M.D.   On: 03/26/2019 14:42        Scheduled Meds:  pantoprazole (PROTONIX) IV  40 mg Intravenous Q24H   Continuous Infusions:  sodium chloride 150 mL/hr at 03/27/19 1140   piperacillin-tazobactam (ZOSYN)  IV 3.375 g (03/27/19 1322)     LOS: 0 days    Time spent:35 mins. More than 50% of that time was spent in counseling and/or coordination of care.      Burnadette PopAmrit Blayklee Mable, MD [facility] Pager [phone]  If 7PM-7AM, please contact night-coverage [email or web address] Password [email or web address] 03/27/2019, 1:23 PM

## 2019-03-27 NOTE — Progress Notes (Signed)
Initial Nutrition Assessment  RD working remotely.   DOCUMENTATION CODES:   Obesity unspecified  INTERVENTION:  - will order Boost Breeze po TID, each supplement provides 250 kcal and 9 grams of protein - Will order 30 mL Prostat BID, each supplement provides 100 kcal and 15 grams of protein. - will order daily multivitamin with minerals. - diet advancement as medically feasible. - continue to encourage PO intakes.    NUTRITION DIAGNOSIS:   Inadequate protein intake related to other (see comment)(current diet order) as evidenced by other (comment)(CLD does not meet estimated protein need.).  GOAL:   Patient will meet greater than or equal to 90% of their needs  MONITOR:   PO intake, Supplement acceptance, Diet advancement, Labs, Weight trends  REASON FOR ASSESSMENT:   Malnutrition Screening Tool  ASSESSMENT:   33 year old male with history of cholelithiasis who presented with abdominal pain, vomiting. He has been experiencing on and off abdominal pain and experienced severe epigastric abdominal pain with associated N/V the day PTA. On presentation, it was noted that WBCs, lipase, and LFTs were elevated. RUQ ultrasound showed cholelithiasis without signs of acute cholecystitis; showed dilated common bile duct. MRCP negative for choledocholithiasis but did show cholelithiasis, pancreatitis, pancreatic necrosis, and duodenal wall thickening. GI and general surgery following.  Diet advanced from NPO to CLD today at 2:45 PM with no PO intakes since that time. Patient has been informed to only take small, slow sips. Current weight is 255 lb and no PTA weight information is available.   Per notes: severe pancreatitis with pancreatic necrosis s/p MRCP, improvement in abdominal pain, cholelithiasis. GI's note from today states that patient will need cholecystectomy during this hospitalization. Surgery following and states same as GI: will need lap chole when pancreatitis has improved.      Medications reviewed. Labs reviewed; Ca: 8.3 mg/dl, Alk Phos elevated, lipase: 36 units/L, LFTs elevated.  IVF; NS @ 150 ml/hr.     NUTRITION - FOCUSED PHYSICAL EXAM:  unable to complete at this time.   Diet Order:   Diet Order            Diet clear liquid Room service appropriate? Yes; Fluid consistency: Thin  Diet effective now              EDUCATION NEEDS:   Not appropriate for education at this time  Skin:  Skin Assessment: Reviewed RN Assessment  Last BM:  PTA/unknown  Height:   Ht Readings from Last 1 Encounters:  03/25/19 5' 11"  (1.803 m)    Weight:   Wt Readings from Last 1 Encounters:  03/25/19 115.7 kg    Ideal Body Weight:  78.2 kg  BMI:  Body mass index is 35.57 kg/m.  Estimated Nutritional Needs:   Kcal:  6808-8110 kcal  Protein:  115-125 grams  Fluid:  >/= 2.2 L/day     Jarome Matin, MS, RD, LDN, [facility] Inpatient Clinical Dietitian Pager # [phone] After hours/weekend pager # [phone]

## 2019-03-28 DIAGNOSIS — R945 Abnormal results of liver function studies: Secondary | ICD-10-CM

## 2019-03-28 LAB — CBC WITH DIFFERENTIAL/PLATELET
Abs Immature Granulocytes: 0.21 10*3/uL — ABNORMAL HIGH (ref 0.00–0.07)
Basophils Absolute: 0.1 10*3/uL (ref 0.0–0.1)
Basophils Relative: 0 %
Eosinophils Absolute: 0.1 10*3/uL (ref 0.0–0.5)
Eosinophils Relative: 0 %
HCT: 48.6 % (ref 39.0–52.0)
Hemoglobin: 15.1 g/dL (ref 13.0–17.0)
Immature Granulocytes: 1 %
Lymphocytes Relative: 5 %
Lymphs Abs: 0.8 10*3/uL (ref 0.7–4.0)
MCH: 30 pg (ref 26.0–34.0)
MCHC: 31.1 g/dL (ref 30.0–36.0)
MCV: 96.6 fL (ref 80.0–100.0)
Monocytes Absolute: 0.9 10*3/uL (ref 0.1–1.0)
Monocytes Relative: 5 %
Neutro Abs: 15.1 10*3/uL — ABNORMAL HIGH (ref 1.7–7.7)
Neutrophils Relative %: 89 %
Platelets: 169 10*3/uL (ref 150–400)
RBC: 5.03 MIL/uL (ref 4.22–5.81)
RDW: 13.1 % (ref 11.5–15.5)
WBC: 17.1 10*3/uL — ABNORMAL HIGH (ref 4.0–10.5)
nRBC: 0 % (ref 0.0–0.2)

## 2019-03-28 LAB — COMPREHENSIVE METABOLIC PANEL
ALT: 232 U/L — ABNORMAL HIGH (ref 0–44)
AST: 47 U/L — ABNORMAL HIGH (ref 15–41)
Albumin: 3.1 g/dL — ABNORMAL LOW (ref 3.5–5.0)
Alkaline Phosphatase: 117 U/L (ref 38–126)
Anion gap: 8 (ref 5–15)
BUN: 12 mg/dL (ref 6–20)
CO2: 26 mmol/L (ref 22–32)
Calcium: 8.1 mg/dL — ABNORMAL LOW (ref 8.9–10.3)
Chloride: 101 mmol/L (ref 98–111)
Creatinine, Ser: 0.75 mg/dL (ref 0.61–1.24)
GFR calc Af Amer: >60 mL/min (ref >60)
GFR calc non Af Amer: >60 mL/min (ref >60)
Glucose, Bld: 122 mg/dL — ABNORMAL HIGH (ref 70–99)
Potassium: 3.8 mmol/L (ref 3.5–5.1)
Sodium: 135 mmol/L (ref 135–145)
Total Bilirubin: 3.5 mg/dL — ABNORMAL HIGH (ref 0.3–1.2)
Total Protein: 6.3 g/dL — ABNORMAL LOW (ref 6.5–8.1)

## 2019-03-28 LAB — SEDIMENTATION RATE: Sed Rate: 25 mm/hr — ABNORMAL HIGH (ref 0–16)

## 2019-03-28 LAB — BILIRUBIN, DIRECT: Bilirubin, Direct: 1.2 mg/dL — ABNORMAL HIGH (ref 0.0–0.2)

## 2019-03-28 LAB — LIPASE, BLOOD: Lipase: 174 U/L — ABNORMAL HIGH (ref 11–51)

## 2019-03-28 LAB — C-REACTIVE PROTEIN: CRP: 34.3 mg/dL — ABNORMAL HIGH (ref <1.0)

## 2019-03-28 MED ORDER — CHLORHEXIDINE GLUCONATE CLOTH 2 % EX PADS
6.0000 | MEDICATED_PAD | Freq: Once | CUTANEOUS | Status: AC
  Administered 2019-03-28: 22:00:00 6 via TOPICAL

## 2019-03-28 MED ORDER — CHLORHEXIDINE GLUCONATE CLOTH 2 % EX PADS
6.0000 | MEDICATED_PAD | Freq: Once | CUTANEOUS | Status: AC
  Administered 2019-03-29: 06:00:00 6 via TOPICAL

## 2019-03-28 MED ORDER — BUPIVACAINE LIPOSOME 1.3 % IJ SUSP
20.0000 mL | INTRAMUSCULAR | Status: DC
  Filled 2019-03-28: qty 20

## 2019-03-28 MED ORDER — SODIUM CHLORIDE 0.9 % IV SOLN
2.0000 g | INTRAVENOUS | Status: AC
  Administered 2019-03-29: 2 g via INTRAVENOUS
  Filled 2019-03-28: qty 20

## 2019-03-28 MED ORDER — ACETAMINOPHEN 500 MG PO TABS
1000.0000 mg | ORAL_TABLET | ORAL | Status: AC
  Administered 2019-03-29: 12:00:00 1000 mg via ORAL
  Filled 2019-03-28: qty 2

## 2019-03-28 MED ORDER — SODIUM CHLORIDE 0.9 % IV SOLN
1.0000 g | Freq: Three times a day (TID) | INTRAVENOUS | Status: DC
  Administered 2019-03-28 – 2019-03-30 (×5): 1 g via INTRAVENOUS
  Filled 2019-03-28 (×9): qty 1

## 2019-03-28 MED ORDER — GABAPENTIN 300 MG PO CAPS
300.0000 mg | ORAL_CAPSULE | ORAL | Status: AC
  Administered 2019-03-29: 12:00:00 300 mg via ORAL
  Filled 2019-03-28: qty 1

## 2019-03-28 MED ORDER — LACTATED RINGERS IV SOLN
INTRAVENOUS | Status: DC
  Administered 2019-03-28 – 2019-03-29 (×3): via INTRAVENOUS

## 2019-03-28 MED ORDER — POLYETHYLENE GLYCOL 3350 17 G PO PACK
17.0000 g | PACK | Freq: Every day | ORAL | Status: DC
  Administered 2019-03-28 – 2019-03-30 (×2): 17 g via ORAL
  Filled 2019-03-28 (×3): qty 1

## 2019-03-28 MED ORDER — LACTATED RINGERS IV BOLUS: 1000.0000 mL | Freq: Three times a day (TID) | INTRAVENOUS | Status: AC | PRN

## 2019-03-28 MED ORDER — METRONIDAZOLE IN NACL 5-0.79 MG/ML-% IV SOLN
500.0000 mg | INTRAVENOUS | Status: AC
  Administered 2019-03-29: 500 mg via INTRAVENOUS
  Filled 2019-03-28: qty 100

## 2019-03-28 NOTE — Progress Notes (Addendum)
Patient ID: Benjamin Gregory, male   DOB: 07/13/1986, 33 y.o.   MRN: [id number]    Progress Note   Subjective  Day # 3  He says he feels better-but still requiring IV pain meds q 3 hrs- pain still 7-8/10 when meds wear off.  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  LFT's trending down, creatinine 0.75 Lipase down to 174 WBC 17.1  On merepenum    Objective   Vital signs in last 24 hours: Temp:  [98 F (36.7 C)-98.9 F (37.2 C)] 98 F (36.7 C) (06/08 0556) Pulse Rate:  [103-125] 114 (06/08 0556) Resp:  [14-16] 14 (06/08 0556) BP: (119-135)/(68-91) 131/91 (06/08 0556) SpO2:  [94 %-98 %] 98 % (06/08 0556) Last BM Date: 03/25/19 General:    white male in NAD, up in chair Heart: tachy Regular rate and rhythm; no murmurs Lungs: Respirations even and unlabored, lungs CTA bilaterally Abdomen:  Soft, diffusely tender across upper abdomen-  BS+ Extremities:  Without edema. Neurologic:  Alert and oriented,  grossly normal neurologically. Psych:  Cooperative. Normal mood and affect.  Intake/Output from previous day: 06/07 0701 - 06/08 0700 In: 240 [P.O.:240] Out: 8 [Urine:7; Stool:1] Intake/Output this shift: Total I/O In: 240 [P.O.:240] Out: 1 [Urine:1]  Lab Results: Recent Labs    03/26/19 0552 03/27/19 0603 03/28/19 0559  WBC 16.4* 24.4* 17.1*  HGB 15.7 16.9 15.1  HCT 48.5 51.6 48.6  PLT 257 236 169   BMET Recent Labs    03/25/19 2123 03/27/19 0603 03/28/19 0559  NA 141 135 135  K 4.4 4.0 3.8  CL 102 101 101  CO2 27 25 26   GLUCOSE 183* 122* 122*  BUN 12 11 12   CREATININE 0.93 0.83 0.75  CALCIUM 9.7 8.3* 8.1*   LFT Recent Labs    03/26/19 0552  03/28/19 0559  PROT 7.3   < > 6.3*  ALBUMIN 3.9   < > 3.1*  AST 235*   < > 47*  ALT 575*    < > 232*  ALKPHOS 230*   < > 117  BILITOT 3.1*   < > 3.5*  BILIDIR 1.9*  --   --   IBILI 1.2*  --   --    < > = values in this interval not displayed.   PT/INR No results for input(s): LABPROT, INR in the last 72 hours.  Studies/Results: Mr 3d Recon At Scanner  Result Date: 03/26/2019 CLINICAL DATA:  Pancreatitis. Rule out choledocholithiasis. Abdominal pain and vomiting. EXAM: MRI ABDOMEN WITHOUT AND WITH CONTRAST (INCLUDING MRCP) TECHNIQUE: Multiplanar multisequence MR imaging of the abdomen was performed both before and after the administration of intravenous contrast. Heavily T2-weighted images of the biliary and pancreatic ducts were obtained, and three-dimensional MRCP images were rendered by post processing. CONTRAST:  10 cc Gadavist COMPARISON:  Ultrasound of 03/25/2019 FINDINGS: Lower chest: Trace left pleural fluid is likely physiologic. Normal heart size. Hepatobiliary: Normal liver. Multiple gallstones on the order of 3-4 mm. No intra or extrahepatic biliary duct dilatation. The common duct is normal in caliber, on the order of 4-5 mm. No choledocholithiasis. Pancreas: Marked pancreatic and peripancreatic edema with fluid throughout the upper abdomen. Pancreatic necrosis involving the body, including on image 51/1103 and 47/11102. No well-circumscribed peripancreatic fluid collection. Spleen:  Normal in size, without focal abnormality. Adrenals/Urinary  Tract: Normal adrenal glands. Normal kidneys, without hydronephrosis. Stomach/Bowel: Normal stomach. The descending duodenum is thick walled, including on 64/1100 3. Vascular/Lymphatic: Normal caliber of the aorta and branch vessels. Patent portal and splenic veins. No abdominal adenopathy. Other:  Small volume intraperitoneal fluid, likely secondary. Musculoskeletal: No acute osseous abnormality. IMPRESSION: 1. Cholelithiasis, but no biliary duct dilatation or evidence of choledocholithiasis. 2. Marked pancreatitis with pancreatic necrosis  involving the body. 3. Duodenal wall thickening, likely related to secondary duodenitis. Electronically Signed   By: Abigail Miyamoto M.D.   On: 03/26/2019 14:42   Mr Abdomen Mrcp Moise Boring Contast  Result Date: 03/26/2019 CLINICAL DATA:  Pancreatitis. Rule out choledocholithiasis. Abdominal pain and vomiting. EXAM: MRI ABDOMEN WITHOUT AND WITH CONTRAST (INCLUDING MRCP) TECHNIQUE: Multiplanar multisequence MR imaging of the abdomen was performed both before and after the administration of intravenous contrast. Heavily T2-weighted images of the biliary and pancreatic ducts were obtained, and three-dimensional MRCP images were rendered by post processing. CONTRAST:  10 cc Gadavist COMPARISON:  Ultrasound of 03/25/2019 FINDINGS: Lower chest: Trace left pleural fluid is likely physiologic. Normal heart size. Hepatobiliary: Normal liver. Multiple gallstones on the order of 3-4 mm. No intra or extrahepatic biliary duct dilatation. The common duct is normal in caliber, on the order of 4-5 mm. No choledocholithiasis. Pancreas: Marked pancreatic and peripancreatic edema with fluid throughout the upper abdomen. Pancreatic necrosis involving the body, including on image 51/1103 and 47/11102. No well-circumscribed peripancreatic fluid collection. Spleen:  Normal in size, without focal abnormality. Adrenals/Urinary Tract: Normal adrenal glands. Normal kidneys, without hydronephrosis. Stomach/Bowel: Normal stomach. The descending duodenum is thick walled, including on 64/1100 3. Vascular/Lymphatic: Normal caliber of the aorta and branch vessels. Patent portal and splenic veins. No abdominal adenopathy. Other:  Small volume intraperitoneal fluid, likely secondary. Musculoskeletal: No acute osseous abnormality. IMPRESSION: 1. Cholelithiasis, but no biliary duct dilatation or evidence of choledocholithiasis. 2. 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 3. Duodenal wall thickening, likely related to secondary  duodenitis. Electronically Signed   By: Abigail Miyamoto M.D.   On: 03/26/2019 14:42       Assessment / Plan:    #1  33 yo  WM admitted 6/6 with acute gallstone pancreatitis. MRCP yesterday negative for choledocholithiasis.  He does have cholelithiasis, and diffuse pancreatic and peripancreatic edema as well as concern for pancreatic necrosis in the pancreatic body  He is hemodynamically stable, pain is improved over admission though still severe and requiring every 3 hours IV narcotics.  LFTs are trending down, continues with leukocytosis though improved over yesterday.  He is now being covered with meropenem due to concerns of pancreatic necrosis.  Plan; continue  Liquids- try full liquids IV fluids had been decreased, patient is not eating remains tachycardic will increase to 125/h Pain control Repeat labs in a.m. Patient will need lap chole this admit.  He has significant pancreatitis, remains symptomatic and not sure  he will be ready for cholecystectomy for another few days.          Principal Problem:   Choledocholithiasis Active Problems:   Gall Villasenor pancreatitis   Encounter for screening for HIV     LOS: 1 day   Isley Weisheit  03/28/2019, 11:04 AM

## 2019-03-28 NOTE — Progress Notes (Signed)
CC: Abdominal pain  Subjective: He sitting up and has clear liquid tray that he is consuming.  He still has some tenderness especially over the mid epigastric area.  He says it is much better than it was.   Objective: Vital signs in last 24 hours: Temp:  [98 F (36.7 C)-98.9 F (37.2 C)] 98 F (36.7 C) (06/08 0556) Pulse Rate:  [103-125] 114 (06/08 0556) Resp:  [14-16] 14 (06/08 0556) BP: (119-135)/(68-91) 131/91 (06/08 0556) SpO2:  [94 %-98 %] 98 % (06/08 0556)  240 PO Urine x 7 Stool x 1 Afebrile, BP up some, HR 100's  WBC 17.1 H/H 15/48 CMP Latest Ref Rng & Units 03/28/2019 03/27/2019 03/26/2019  Total Bilirubin 0.3 - 1.2 mg/dL 1.6(X3.5(H) 4.1(H) 3.1(H)  Alkaline Phos 38 - 126 U/L 117 156(H) 230(H)  AST 15 - 41 U/L 47(H) 131(H) 235(H)  ALT 0 - 44 U/L 232(H) 403(H) 575(H)  Lipase 2679 >> 361 >> pending today   US 03/25/19:There is cholelithiasis without secondary signs of acute cholecystitis.  Dilated common bile duct suspicious for underlying choledocholithiasis.  Diffuse increased echogenicity with slightly heterogeneous liver.  Appearance typically secondary to fatty infiltration. Fibrosis secondary consideration. No secondary findings of cirrhosis noted. No focal hepatic lesion or intrahepatic biliary duct dilatation.  MRCP 03/26/19: Cholelithiasis, but no biliary duct dilatation or evidence of choledocholithiasis.  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 Duodenal wall thickening, likely related to secondary duodenitis. Intake/Output from previous day: 06/07 0701 - 06/08 0700 In: 240 [P.O.:240] Out: 8 [Urine:7; Stool:1] Intake/Output this shift: No intake/output data recorded.  General appearance: alert, cooperative and no distress Resp: clear to auscultation bilaterally GI: soft some ongoing tenderness, mid abdomen.  He pointed to both mid abdomen, RUQ and LUQ as pain sites on admit.  Lab Results:  Recent Labs    03/27/19 0603 03/28/19 0559   WBC 24.4* 17.1*  HGB 16.9 15.1  HCT 51.6 48.6  PLT 236 169    BMET Recent Labs    03/27/19 0603 03/28/19 0559  NA 135 135  K 4.0 3.8  CL 101 101  CO2 25 26  GLUCOSE 122* 122*  BUN 11 12  CREATININE 0.83 0.75  CALCIUM 8.3* 8.1*   PT/INR No results for input(s): LABPROT, INR in the last 72 hours.  Recent Labs  Lab 03/25/19 2123 03/26/19 0552 03/27/19 0603 03/28/19 0559  AST 281* 235* 131* 47*  ALT 663* 575* 403* 232*  ALKPHOS 253* 230* 156* 117  BILITOT 5.8* 3.1* 4.1* 3.5*  PROT 8.3* 7.3 6.7 6.3*  ALBUMIN 4.7 3.9 3.5 3.1*     Lipase     Component Value Date/Time   LIPASE 361 (H) 03/27/2019 0603   Prior to Admission medications   Medication Sig Start Date End Date Taking? Authorizing Provider  OVER THE COUNTER MEDICATION Take 1 tablet by mouth once.   Yes [provider]      Medications: . feeding supplement  1 Container Oral BID BM  . feeding supplement (PRO-STAT SUGAR FREE 64)  30 mL Oral BID  . multivitamin with minerals  1 tablet Oral Daily  . pantoprazole (PROTONIX) IV  40 mg Intravenous Q24H   . sodium chloride 150 mL/hr at 03/27/19 1140  . piperacillin-tazobactam (ZOSYN)  IV 3.375 g (03/28/19 [id number])    Assessment/Plan BMI 35.5  Gallstone pancreatitis/cholelithiasis  - MRCP w significnat pancreatic /peripancreatic inflammation/possible necrosis  - elevated LFT's  FEN: IV fluids/clear liquids >> NPO ID:  Rocephin  x 1; Flagyl 6/6 x 2 doses;  6/6  Zosyn 6/6 >> day 3 DVT:  SCD's  - he can have DVT prophylaxis anticoagulation today from our standpoint hold after midnight Follow up:  TBD POC: Benjamin Gregory, Benjamin Gregory Mother   [phone]   Plan;  Lipase is pending, he has taken clears this AM.  I will let him stay on clears and make NPO tonight.  If labs and pain well controlled possible cholecystectomy tomorrow.        LOS: 1 day    Benjamin Gregory 03/28/2019 [id number]

## 2019-03-28 NOTE — Progress Notes (Signed)
PROGRESS NOTE    Benjamin SkainsDerek Gregory  ZOX:[id number] DOB: 10/21/1985 DOA: 03/25/2019 PCP: Patient, No Pcp Per   Brief Narrative: Patient is a 33 year old male with history of cholelithiasis who presented with abdominal pain, vomiting. Patient was having on and off abdominal pain in the past.   He was having severe epigastric abdominal pain yesterday with nausea and vomiting.  When he presented to the emergency department he had elevated white cell counts, lipase, liver enzymes.  Right upper quadrant ultrasound showed cholelithiasis without signs of acute cholecystitis.  Also showed dilated common bile duct suspicious for underlying choledocholithiasis. 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 GI and general surgery following  Assessment & Plan:   Principal Problem:   Choledocholithiasis Active Problems:   Gall Littrell pancreatitis   Encounter for screening for HIV   Severe pancreatitis with pancreatic necrosis: MRCP as above.  General surgery and GI following.  Continue IV fluids.  Continue n.p.o.  Continue pain management.  Liver enzymes, lipase have improved. Continue IV antibioticsbecause of pancreatic necrosis.On meropenem. Patient says his abdomen pain has improved.  Still has significant tenderness in the epigastric region.  Has not passed any flatus, does not have a bowel movement yet.  Cholelithiasis: Plan for cholecystectomy as per general surgery.  Nutrition Problem: Inadequate protein intake Etiology: other (see comment)(current diet order)      DVT prophylaxis:SCDs Code Status: Full Family Communication: None present at the bedside Disposition Plan: Home after complete resolution of pancreatitis   Consultants: GI, general surgery  Procedures: MRCP  Antimicrobials:  Anti-infectives (From admission, onward)   Start     Dose/Rate Route Frequency Ordered  Stop   03/29/19 0600  cefTRIAXone (ROCEPHIN) 2 g in sodium chloride 0.9 % 100 mL IVPB     2 g 200 mL/hr over 30 Minutes Intravenous On call to O.R. 03/28/19 1302 03/30/19 0559   03/29/19 0600  metroNIDAZOLE (FLAGYL) IVPB 500 mg     500 mg 100 mL/hr over 60 Minutes Intravenous On call to O.R. 03/28/19 1302 03/30/19 0559   03/28/19 1030  meropenem (MERREM) 1 g in sodium chloride 0.9 % 100 mL IVPB     1 g 200 mL/hr over 30 Minutes Intravenous Every 8 hours 03/28/19 0904     03/26/19 2200  cefTRIAXone (ROCEPHIN) 2 g in sodium chloride 0.9 % 100 mL IVPB  Status:  Discontinued     2 g 200 mL/hr over 30 Minutes Intravenous Every 24 hours 03/26/19 0125 03/26/19 1220   03/26/19 1400  piperacillin-tazobactam (ZOSYN) IVPB 3.375 g  Status:  Discontinued     3.375 g 12.5 mL/hr over 240 Minutes Intravenous Every 8 hours 03/26/19 1242 03/28/19 0904   03/26/19 0145  metroNIDAZOLE (FLAGYL) IVPB 500 mg  Status:  Discontinued     500 mg 100 mL/hr over 60 Minutes Intravenous Every 8 hours 03/26/19 0125 03/26/19 1220   03/26/19 0145  piperacillin-tazobactam (ZOSYN) IVPB 3.375 g     3.375 g 100 mL/hr over 30 Minutes Intravenous  Once 03/26/19 0132 03/26/19 0333   03/26/19 0030  cefTRIAXone (ROCEPHIN) 2 g in sodium chloride 0.9 % 100 mL IVPB     2 g 200 mL/hr over 30 Minutes Intravenous  Once 03/26/19 0020 03/26/19 0204      Subjective: Patient seen and examined the bedside this morning.  Hemodynamically stable, mildly tachycardic.  Still complains of abdominal pain but better than yesteday.  No bowel  movement or passage of flatus.  Objective: Vitals:   03/27/19 2054 03/28/19 0211 03/28/19 0556 03/28/19 1340  BP: 119/75  (!) 131/91 135/77  Pulse: (!) 120 (!) 103 (!) 114 (!) 109  Resp: 16  14 20   Temp: 98.5 F (36.9 C)  98 F (36.7 C)   TempSrc: Oral  Oral   SpO2: 96%  98% 96%  Weight:      Height:        Intake/Output Summary (Last 24 hours) at 03/28/2019 1342 Last data filed at 03/28/2019  0915 Gross per 24 hour  Intake 480 ml  Output 7 ml  Net 473 ml   Filed Weights   03/25/19 2056  Weight: 115.7 kg    Examination:  General exam: Appears calm and comfortable ,Not in distress,obese HEENT:PERRL,Oral mucosa moist, Ear/Nose normal on gross exam Respiratory system: Bilateral equal air entry, normal vesicular breath sounds, no wheezes or crackles  Cardiovascular system: S1 & S2 heard, RRR. No JVD, murmurs, rubs, gallops or clicks. Gastrointestinal system: Abdomen is nondistended, soft .  Tenderness on the epigastric region.  No organomegaly or masses felt. Bowel sounds sluggish Central nervous system: Alert and oriented. No focal neurological deficits. Extremities: No edema, no clubbing ,no cyanosis, distal peripheral pulses palpable. Skin: No rashes, lesions or ulcers,no icterus ,no pallor MSK: Normal muscle bulk,tone ,power Psychiatry: Judgement and insight appear normal. Mood & affect appropriate.      Data Reviewed: I have personally reviewed following labs and imaging studies  CBC: Recent Labs  Lab 03/25/19 2123 03/26/19 0552 03/27/19 0603 03/28/19 0559  WBC 15.9* 16.4* 24.4* 17.1*  NEUTROABS  --  15.3* 22.2* 15.1*  HGB 15.4 15.7 16.9 15.1  HCT 47.6 48.5 51.6 48.6  MCV 95.2 95.1 94.3 96.6  PLT 250 257 236 169   Basic Metabolic Panel: Recent Labs  Lab 03/25/19 2123 03/27/19 0603 03/28/19 0559  NA 141 135 135  K 4.4 4.0 3.8  CL 102 101 101  CO2 27 25 26   GLUCOSE 183* 122* 122*  BUN 12 11 12   CREATININE 0.93 0.83 0.75  CALCIUM 9.7 8.3* 8.1*   GFR: Estimated Creatinine Clearance: 174.7 mL/min (by C-G formula based on SCr of 0.75 mg/dL). Liver Function Tests: Recent Labs  Lab 03/25/19 2123 03/26/19 0552 03/27/19 0603 03/28/19 0559  AST 281* 235* 131* 47*  ALT 663* 575* 403* 232*  ALKPHOS 253* 230* 156* 117  BILITOT 5.8* 3.1* 4.1* 3.5*  PROT 8.3* 7.3 6.7 6.3*  ALBUMIN 4.7 3.9 3.5 3.1*   Recent Labs  Lab 03/25/19 2123 03/27/19 0603  03/28/19 0559 03/28/19 0729  LIPASE 2,679* 361* 174* DUPLICATE REQUEST   No results for input(s): AMMONIA in the last 168 hours. Coagulation Profile: No results for input(s): INR, PROTIME in the last 168 hours. Cardiac Enzymes: No results for input(s): CKTOTAL, CKMB, CKMBINDEX, TROPONINI in the last 168 hours. BNP (last 3 results) No results for input(s): PROBNP in the last 8760 hours. HbA1C: No results for input(s): HGBA1C in the last 72 hours. CBG: No results for input(s): GLUCAP in the last 168 hours. Lipid Profile: Recent Labs    03/27/19 0603  TRIG 60   Thyroid Function Tests: No results for input(s): TSH, T4TOTAL, FREET4, T3FREE, THYROIDAB in the last 72 hours. Anemia Panel: No results for input(s): VITAMINB12, FOLATE, FERRITIN, TIBC, IRON, RETICCTPCT in the last 72 hours. Sepsis Labs: No results for input(s): PROCALCITON, LATICACIDVEN in the last 168 hours.  Recent Results (from the past 240 hour(s))  SARS Coronavirus 2 (CEPHEID - Performed in [facility] hospital lab), Hosp Order     Status: None   Collection Time: 03/25/19 11:50 PM  Result Value Ref Range Status   SARS Coronavirus 2 NEGATIVE NEGATIVE Final    Comment: (NOTE) If result is NEGATIVE SARS-CoV-2 target nucleic acids are NOT DETECTED. The SARS-CoV-2 RNA is generally detectable in upper and lower  respiratory specimens during the acute phase of infection. The lowest  concentration of SARS-CoV-2 viral copies this assay can detect is 250  copies / mL. A negative result does not preclude SARS-CoV-2 infection  and should not be used as the sole basis for treatment or other  patient management decisions.  A negative result may occur with  improper specimen collection / handling, submission of specimen other  than nasopharyngeal swab, presence of viral mutation(s) within the  areas targeted by this assay, and inadequate number of viral copies  (<250 copies / mL). A negative result must be combined with  clinical  observations, patient history, and epidemiological information. If result is POSITIVE SARS-CoV-2 target nucleic acids are DETECTED. The SARS-CoV-2 RNA is generally detectable in upper and lower  respiratory specimens dur ing the acute phase of infection.  Positive  results are indicative of active infection with SARS-CoV-2.  Clinical  correlation with patient history and other diagnostic information is  necessary to determine patient infection status.  Positive results do  not rule out bacterial infection or co-infection with other viruses. If result is PRESUMPTIVE POSTIVE SARS-CoV-2 nucleic acids MAY BE PRESENT.   A presumptive positive result was obtained on the submitted specimen  and confirmed on repeat testing.  While 2019 novel coronavirus  (SARS-CoV-2) nucleic acids may be present in the submitted sample  additional confirmatory testing may be necessary for epidemiological  and / or clinical management purposes  to differentiate between  SARS-CoV-2 and other Sarbecovirus currently known to infect humans.  If clinically indicated additional testing with an alternate test  methodology [phone]) is advised. The SARS-CoV-2 RNA is generally  detectable in upper and lower respiratory sp ecimens during the acute  phase of infection. The expected result is Negative. Fact Sheet for Patients:  BoilerBrush.com.cy Fact Sheet for Healthcare Providers: https://pope.com/ This test is not yet approved or cleared by the [place] FDA and has been authorized for detection and/or diagnosis of SARS-CoV-2 by FDA under an Emergency Use Authorization (EUA).  This EUA will remain in effect (meaning this test can be used) for the duration of the COVID-19 declaration under Section 564(b)(1) of the Act, 21 U.S.C. section 360bbb-3(b)(1), unless the authorization is terminated or revoked sooner. Performed at [facility],  [address], [place], [place] [address]          Radiology Studies: Mr 3d Recon At Scanner  Result Date: 03/26/2019 CLINICAL DATA:  Pancreatitis. Rule out choledocholithiasis. Abdominal pain and vomiting. EXAM: MRI ABDOMEN WITHOUT AND WITH CONTRAST (INCLUDING MRCP) TECHNIQUE: Multiplanar multisequence MR imaging of the abdomen was performed both before and after the administration of intravenous contrast. Heavily T2-weighted images of the biliary and pancreatic ducts were obtained, and three-dimensional MRCP images were rendered by post processing. CONTRAST:  10 cc Gadavist COMPARISON:  Ultrasound of 03/25/2019 FINDINGS: Lower chest: Trace left pleural fluid is likely physiologic. Normal heart size. Hepatobiliary: Normal liver. Multiple gallstones on the order of 3-4 mm. No intra or extrahepatic biliary duct dilatation. The common duct is normal in caliber, on the order of 4-5 mm. No choledocholithiasis.  Pancreas: Marked pancreatic and peripancreatic edema with fluid throughout the upper abdomen. Pancreatic necrosis involving the body, including on image 51/1103 and 47/11102. No well-circumscribed peripancreatic fluid collection. Spleen:  Normal in size, without focal abnormality. Adrenals/Urinary Tract: Normal adrenal glands. Normal kidneys, without hydronephrosis. Stomach/Bowel: Normal stomach. The descending duodenum is thick walled, including on 64/1100 3. Vascular/Lymphatic: Normal caliber of the aorta and branch vessels. Patent portal and splenic veins. No abdominal adenopathy. Other:  Small volume intraperitoneal fluid, likely secondary. Musculoskeletal: No acute osseous abnormality. IMPRESSION: 1. Cholelithiasis, but no biliary duct dilatation or evidence of choledocholithiasis. 2. 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 3. Duodenal wall thickening, likely related to secondary duodenitis. Electronically Signed   By: Jeronimo GreavesKyle  Talbot M.D.   On: 03/26/2019 14:42   Mr  Abdomen Mrcp Vivien RossettiW Wo Contast  Result Date: 03/26/2019 CLINICAL DATA:  Pancreatitis. Rule out choledocholithiasis. Abdominal pain and vomiting. EXAM: MRI ABDOMEN WITHOUT AND WITH CONTRAST (INCLUDING MRCP) TECHNIQUE: Multiplanar multisequence MR imaging of the abdomen was performed both before and after the administration of intravenous contrast. Heavily T2-weighted images of the biliary and pancreatic ducts were obtained, and three-dimensional MRCP images were rendered by post processing. CONTRAST:  10 cc Gadavist COMPARISON:  Ultrasound of 03/25/2019 FINDINGS: Lower chest: Trace left pleural fluid is likely physiologic. Normal heart size. Hepatobiliary: Normal liver. Multiple gallstones on the order of 3-4 mm. No intra or extrahepatic biliary duct dilatation. The common duct is normal in caliber, on the order of 4-5 mm. No choledocholithiasis. Pancreas: Marked pancreatic and peripancreatic edema with fluid throughout the upper abdomen. Pancreatic necrosis involving the body, including on image 51/1103 and 47/11102. No well-circumscribed peripancreatic fluid collection. Spleen:  Normal in size, without focal abnormality. Adrenals/Urinary Tract: Normal adrenal glands. Normal kidneys, without hydronephrosis. Stomach/Bowel: Normal stomach. The descending duodenum is thick walled, including on 64/1100 3. Vascular/Lymphatic: Normal caliber of the aorta and branch vessels. Patent portal and splenic veins. No abdominal adenopathy. Other:  Small volume intraperitoneal fluid, likely secondary. Musculoskeletal: No acute osseous abnormality. IMPRESSION: 1. Cholelithiasis, but no biliary duct dilatation or evidence of choledocholithiasis. 2. 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 3. Duodenal wall thickening, likely related to secondary duodenitis. Electronically Signed   By: Jeronimo GreavesKyle  Talbot M.D.   On: 03/26/2019 14:42        Scheduled Meds:  [START ON 03/29/2019] acetaminophen  1,000 mg Oral On Call  to OR   [START ON 03/29/2019] bupivacaine liposome  20 mL Infiltration On Call to OR   Chlorhexidine Gluconate Cloth  6 each Topical Once   And   [START ON 03/29/2019] Chlorhexidine Gluconate Cloth  6 each Topical Once   feeding supplement  1 Container Oral BID BM   feeding supplement (PRO-STAT SUGAR FREE 64)  30 mL Oral BID   [START ON 03/29/2019] gabapentin  300 mg Oral On Call to OR   multivitamin with minerals  1 tablet Oral Daily   pantoprazole (PROTONIX) IV  40 mg Intravenous Q24H   polyethylene glycol  17 g Oral Daily   Continuous Infusions:  [START ON 03/29/2019] cefTRIAXone (ROCEPHIN)  IV     And   [START ON 03/29/2019] metronidazole     lactated ringers     lactated ringers 125 mL/hr at 03/28/19 1220   meropenem (MERREM) IV 1 g (03/28/19 1011)     LOS: 1 day    Time spent:35 mins. More than 50% of that time was spent in counseling and/or coordination of care.  Shelly Coss, MD [facility] Pager [phone]  If 7PM-7AM, please contact night-coverage [email or web address] Password [email or web address] 03/28/2019, 1:42 PM

## 2019-03-29 ENCOUNTER — Encounter (HOSPITAL_COMMUNITY)
Admission: EM | Disposition: A | Discharge status: Home / Self Care | Payer: Self-pay | Source: Home / Self Care | Attending: Internal Medicine

## 2019-03-29 ENCOUNTER — Encounter (HOSPITAL_COMMUNITY): Payer: Self-pay

## 2019-03-29 ENCOUNTER — Inpatient Hospital Stay (HOSPITAL_COMMUNITY): Payer: Commercial Managed Care - PPO

## 2019-03-29 ENCOUNTER — Inpatient Hospital Stay (HOSPITAL_COMMUNITY): Payer: Commercial Managed Care - PPO | Admitting: Certified Registered"

## 2019-03-29 HISTORY — PX: CHOLECYSTECTOMY: SHX55

## 2019-03-29 LAB — COMPREHENSIVE METABOLIC PANEL
ALT: 162 U/L — ABNORMAL HIGH (ref 0–44)
AST: 43 U/L — ABNORMAL HIGH (ref 15–41)
Albumin: 3 g/dL — ABNORMAL LOW (ref 3.5–5.0)
Alkaline Phosphatase: 101 U/L (ref 38–126)
Anion gap: 9 (ref 5–15)
BUN: 9 mg/dL (ref 6–20)
CO2: 30 mmol/L (ref 22–32)
Calcium: 8.2 mg/dL — ABNORMAL LOW (ref 8.9–10.3)
Chloride: 94 mmol/L — ABNORMAL LOW (ref 98–111)
Creatinine, Ser: 0.79 mg/dL (ref 0.61–1.24)
GFR calc Af Amer: >60 mL/min (ref >60)
GFR calc non Af Amer: >60 mL/min (ref >60)
Glucose, Bld: 114 mg/dL — ABNORMAL HIGH (ref 70–99)
Potassium: 3.7 mmol/L (ref 3.5–5.1)
Sodium: 133 mmol/L — ABNORMAL LOW (ref 135–145)
Total Bilirubin: 2.6 mg/dL — ABNORMAL HIGH (ref 0.3–1.2)
Total Protein: 6.7 g/dL (ref 6.5–8.1)

## 2019-03-29 LAB — CBC
HCT: 44.6 % (ref 39.0–52.0)
Hemoglobin: 14.3 g/dL (ref 13.0–17.0)
MCH: 31 pg (ref 26.0–34.0)
MCHC: 32.1 g/dL (ref 30.0–36.0)
MCV: 96.5 fL (ref 80.0–100.0)
Platelets: 238 10*3/uL (ref 150–400)
RBC: 4.62 MIL/uL (ref 4.22–5.81)
RDW: 12.8 % (ref 11.5–15.5)
WBC: 17.2 10*3/uL — ABNORMAL HIGH (ref 4.0–10.5)
nRBC: 0 % (ref 0.0–0.2)

## 2019-03-29 LAB — LIPASE, BLOOD: Lipase: 88 U/L — ABNORMAL HIGH (ref 11–51)

## 2019-03-29 LAB — SARS CORONAVIRUS 2 BY RT PCR (HOSPITAL ORDER, PERFORMED IN ~~LOC~~ HOSPITAL LAB): SARS Coronavirus 2: NEGATIVE

## 2019-03-29 SURGERY — LAPAROSCOPIC CHOLECYSTECTOMY WITH INTRAOPERATIVE CHOLANGIOGRAM
Anesthesia: General

## 2019-03-29 MED ORDER — OXYCODONE HCL 5 MG PO TABS: 5.0000 mg | ORAL_TABLET | Freq: Once | ORAL | Status: DC | PRN

## 2019-03-29 MED ORDER — ESMOLOL HCL 100 MG/10ML IV SOLN
INTRAVENOUS | Status: DC | PRN
  Administered 2019-03-29: 30 mg via INTRAVENOUS
  Administered 2019-03-29: 20 mg via INTRAVENOUS
  Administered 2019-03-29: 30 mg via INTRAVENOUS
  Administered 2019-03-29: 20 mg via INTRAVENOUS

## 2019-03-29 MED ORDER — SUGAMMADEX SODIUM 500 MG/5ML IV SOLN
INTRAVENOUS | Status: AC
  Filled 2019-03-29: qty 5

## 2019-03-29 MED ORDER — ONDANSETRON HCL 4 MG/2ML IJ SOLN
INTRAMUSCULAR | Status: DC | PRN
  Administered 2019-03-29: 4 mg via INTRAVENOUS

## 2019-03-29 MED ORDER — LACTATED RINGERS IR SOLN
Status: DC | PRN
  Administered 2019-03-29: 1000 mL

## 2019-03-29 MED ORDER — 0.9 % SODIUM CHLORIDE (POUR BTL) OPTIME
TOPICAL | Status: DC | PRN
  Administered 2019-03-29: 1000 mL

## 2019-03-29 MED ORDER — BUPIVACAINE-EPINEPHRINE (PF) 0.25% -1:200000 IJ SOLN
INTRAMUSCULAR | Status: AC
  Filled 2019-03-29: qty 30

## 2019-03-29 MED ORDER — PROMETHAZINE HCL 25 MG/ML IJ SOLN: 6.2500 mg | INTRAMUSCULAR | Status: DC | PRN

## 2019-03-29 MED ORDER — METOPROLOL TARTRATE 5 MG/5ML IV SOLN
INTRAVENOUS | Status: DC | PRN
  Administered 2019-03-29: 1 mg via INTRAVENOUS
  Administered 2019-03-29: 1.5 mg via INTRAVENOUS

## 2019-03-29 MED ORDER — OXYCODONE HCL 5 MG/5ML PO SOLN: 5.0000 mg | Freq: Once | ORAL | Status: DC | PRN

## 2019-03-29 MED ORDER — SUGAMMADEX SODIUM 200 MG/2ML IV SOLN
INTRAVENOUS | Status: DC | PRN
  Administered 2019-03-29: 300 mg via INTRAVENOUS

## 2019-03-29 MED ORDER — LIDOCAINE 2% (20 MG/ML) 5 ML SYRINGE
INTRAMUSCULAR | Status: DC | PRN
  Administered 2019-03-29: 100 mg via INTRAVENOUS

## 2019-03-29 MED ORDER — EPHEDRINE 5 MG/ML INJ
INTRAVENOUS | Status: AC
  Filled 2019-03-29: qty 10

## 2019-03-29 MED ORDER — HYDROMORPHONE HCL 1 MG/ML IJ SOLN
0.2500 mg | INTRAMUSCULAR | Status: DC | PRN
  Administered 2019-03-29 (×2): 0.5 mg via INTRAVENOUS

## 2019-03-29 MED ORDER — HYDROMORPHONE HCL 1 MG/ML IJ SOLN: 1.0000 mg | INTRAMUSCULAR | Status: DC | PRN

## 2019-03-29 MED ORDER — OXYCODONE HCL 5 MG PO TABS
5.0000 mg | ORAL_TABLET | ORAL | Status: DC | PRN
  Administered 2019-03-29: 5 mg via ORAL
  Filled 2019-03-29: qty 1

## 2019-03-29 MED ORDER — FENTANYL CITRATE (PF) 250 MCG/5ML IJ SOLN
INTRAMUSCULAR | Status: AC
  Filled 2019-03-29: qty 5

## 2019-03-29 MED ORDER — LACTATED RINGERS IV SOLN
INTRAVENOUS | Status: DC
  Administered 2019-03-30: 06:00:00 via INTRAVENOUS

## 2019-03-29 MED ORDER — DEXAMETHASONE SODIUM PHOSPHATE 10 MG/ML IJ SOLN
INTRAMUSCULAR | Status: AC
  Filled 2019-03-29: qty 1

## 2019-03-29 MED ORDER — MIDAZOLAM HCL 5 MG/5ML IJ SOLN
INTRAMUSCULAR | Status: DC | PRN
  Administered 2019-03-29: 2 mg via INTRAVENOUS

## 2019-03-29 MED ORDER — ROCURONIUM BROMIDE 10 MG/ML (PF) SYRINGE
PREFILLED_SYRINGE | INTRAVENOUS | Status: DC | PRN
  Administered 2019-03-29 (×2): 30 mg via INTRAVENOUS
  Administered 2019-03-29: 10 mg via INTRAVENOUS

## 2019-03-29 MED ORDER — FENTANYL CITRATE (PF) 100 MCG/2ML IJ SOLN
INTRAMUSCULAR | Status: DC | PRN
  Administered 2019-03-29 (×7): 50 ug via INTRAVENOUS

## 2019-03-29 MED ORDER — DEXAMETHASONE SODIUM PHOSPHATE 10 MG/ML IJ SOLN
INTRAMUSCULAR | Status: DC | PRN
  Administered 2019-03-29: 10 mg via INTRAVENOUS

## 2019-03-29 MED ORDER — IOHEXOL 300 MG/ML  SOLN
INTRAMUSCULAR | Status: DC | PRN
  Administered 2019-03-29: 14:00:00 11 mL

## 2019-03-29 MED ORDER — BUPIVACAINE-EPINEPHRINE (PF) 0.25% -1:200000 IJ SOLN
INTRAMUSCULAR | Status: DC | PRN
  Administered 2019-03-29: 17 mL

## 2019-03-29 MED ORDER — DEXMEDETOMIDINE HCL 200 MCG/2ML IV SOLN
INTRAVENOUS | Status: DC | PRN
  Administered 2019-03-29 (×3): 4 ug via INTRAVENOUS

## 2019-03-29 MED ORDER — PROPOFOL 10 MG/ML IV BOLUS
INTRAVENOUS | Status: DC | PRN
  Administered 2019-03-29: 200 mg via INTRAVENOUS

## 2019-03-29 MED ORDER — IBUPROFEN 200 MG PO TABS: 600.0000 mg | ORAL_TABLET | Freq: Four times a day (QID) | ORAL | Status: DC | PRN

## 2019-03-29 MED ORDER — LIDOCAINE HCL 2 % IJ SOLN
INTRAMUSCULAR | Status: AC
  Filled 2019-03-29: qty 20

## 2019-03-29 MED ORDER — MIDAZOLAM HCL 2 MG/2ML IJ SOLN
INTRAMUSCULAR | Status: AC
  Filled 2019-03-29: qty 2

## 2019-03-29 MED ORDER — HYDROMORPHONE HCL 1 MG/ML IJ SOLN
INTRAMUSCULAR | Status: AC
  Filled 2019-03-29: qty 1

## 2019-03-29 MED ORDER — PROPOFOL 10 MG/ML IV BOLUS
INTRAVENOUS | Status: AC
  Filled 2019-03-29: qty 20

## 2019-03-29 MED ORDER — PHENYLEPHRINE 40 MCG/ML (10ML) SYRINGE FOR IV PUSH (FOR BLOOD PRESSURE SUPPORT)
PREFILLED_SYRINGE | INTRAVENOUS | Status: AC
  Filled 2019-03-29: qty 10

## 2019-03-29 MED ORDER — MEPERIDINE HCL 50 MG/ML IJ SOLN: 6.2500 mg | INTRAMUSCULAR | Status: DC | PRN

## 2019-03-29 MED ORDER — SUCCINYLCHOLINE CHLORIDE 200 MG/10ML IV SOSY
PREFILLED_SYRINGE | INTRAVENOUS | Status: DC | PRN
  Administered 2019-03-29: 140 mg via INTRAVENOUS

## 2019-03-29 MED ORDER — ADULT MULTIVITAMIN W/MINERALS CH
1.0000 | ORAL_TABLET | Freq: Every day | ORAL | Status: DC
  Filled 2019-03-29: qty 1

## 2019-03-29 MED ORDER — SCOPOLAMINE 1 MG/3DAYS TD PT72
MEDICATED_PATCH | TRANSDERMAL | Status: AC
  Filled 2019-03-29: qty 1

## 2019-03-29 MED ORDER — ONDANSETRON HCL 4 MG/2ML IJ SOLN
INTRAMUSCULAR | Status: AC
  Filled 2019-03-29: qty 2

## 2019-03-29 SURGICAL SUPPLY — 35 items
APPLIER CLIP 5 13 M/L LIGAMAX5 (MISCELLANEOUS) ×3
CABLE HIGH FREQUENCY MONO STRZ (ELECTRODE) ×3 IMPLANT
CHLORAPREP W/TINT 26 (MISCELLANEOUS) ×3 IMPLANT
CLIP APPLIE 5 13 M/L LIGAMAX5 (MISCELLANEOUS) ×1 IMPLANT
CLIP VESOLOCK MED LG 6/CT (CLIP) ×3 IMPLANT
COVER MAYO STAND STRL (DRAPES) ×3 IMPLANT
COVER TRANSDUCER ULTRASND (DRAPES) ×3 IMPLANT
COVER WAND RF STERILE (DRAPES) IMPLANT
DECANTER SPIKE VIAL GLASS SM (MISCELLANEOUS) ×3 IMPLANT
DERMABOND ADVANCED (GAUZE/BANDAGES/DRESSINGS) ×2
DERMABOND ADVANCED .7 DNX12 (GAUZE/BANDAGES/DRESSINGS) ×1 IMPLANT
DEVICE TROCAR PUNCTURE CLOSURE (ENDOMECHANICALS) ×3 IMPLANT
DISSECTOR BLUNT TIP ENDO 5MM (MISCELLANEOUS) ×3 IMPLANT
DRAPE C-ARM 42X120 X-RAY (DRAPES) ×3 IMPLANT
ELECT REM PT RETURN 15FT ADLT (MISCELLANEOUS) ×3 IMPLANT
GAUZE SPONGE 2X2 8PLY STRL LF (GAUZE/BANDAGES/DRESSINGS) ×1 IMPLANT
GLOVE BIO SURGEON STRL SZ7.5 (GLOVE) ×6 IMPLANT
GOWN STRL REUS W/TWL XL LVL3 (GOWN DISPOSABLE) ×9 IMPLANT
KIT BASIN OR (CUSTOM PROCEDURE TRAY) ×3 IMPLANT
KIT TURNOVER KIT A (KITS) IMPLANT
NEEDLE INSUFFLATION 14GA 120MM (NEEDLE) ×3 IMPLANT
POUCH RETRIEVAL ECOSAC 10 (ENDOMECHANICALS) ×1 IMPLANT
POUCH RETRIEVAL ECOSAC 10MM (ENDOMECHANICALS) ×2
SCISSORS LAP 5X35 DISP (ENDOMECHANICALS) ×3 IMPLANT
SET CHOLANGIOGRAPH MIX (MISCELLANEOUS) ×3 IMPLANT
SET IRRIG TUBING LAPAROSCOPIC (IRRIGATION / IRRIGATOR) ×3 IMPLANT
SET TUBE SMOKE EVAC HIGH FLOW (TUBING) ×3 IMPLANT
SLEEVE XCEL OPT CAN 5 100 (ENDOMECHANICALS) ×6 IMPLANT
SPONGE GAUZE 2X2 STER 10/PKG (GAUZE/BANDAGES/DRESSINGS) ×2
SUT MNCRL AB 4-0 PS2 18 (SUTURE) ×6 IMPLANT
SUT VIC AB 0 UR5 27 (SUTURE) ×3 IMPLANT
TOWEL OR 17X26 10 PK STRL BLUE (TOWEL DISPOSABLE) ×3 IMPLANT
TRAY LAPAROSCOPIC (CUSTOM PROCEDURE TRAY) ×3 IMPLANT
TROCAR BLADELESS OPT 5 100 (ENDOMECHANICALS) ×3 IMPLANT
TROCAR XCEL NON-BLD 11X100MML (ENDOMECHANICALS) ×3 IMPLANT

## 2019-03-29 NOTE — Transfer of Care (Signed)
Immediate Anesthesia Transfer of Care Note  Patient: Benjamin Gregory  Procedure(s) Performed: LAPAROSCOPIC CHOLECYSTECTOMY WITH INTRAOPERATIVE CHOLANGIOGRAM (N/A )  Patient Location: PACU  Anesthesia Type:General  Level of Consciousness: awake, sedated and patient cooperative  Airway & Oxygen Therapy: Patient Spontanous Breathing and Patient connected to face mask oxygen  Post-op Assessment: Report given to RN and Post -op Vital signs reviewed and stable  Post vital signs: Reviewed and stable  Last Vitals:  Vitals Value Taken Time  BP 110/63 03/29/2019  3:00 PM  Temp    Pulse 86 03/29/2019  3:01 PM  Resp 26 03/29/2019  3:01 PM  SpO2 100 % 03/29/2019  3:01 PM  Vitals shown include unvalidated device data.  Last Pain:  Vitals:   03/29/19 1140  TempSrc: Oral  PainSc:       Patients Stated Pain Goal: 2 (48/25/00 3704)  Complications: No apparent anesthesia complications

## 2019-03-29 NOTE — Anesthesia Procedure Notes (Signed)
Procedure Name: Intubation Date/Time: 03/29/2019 1:26 PM Performed by: Silas Sacramento, CRNA Pre-anesthesia Checklist: Patient identified, Emergency Drugs available, Suction available and Patient being monitored Patient Re-evaluated:Patient Re-evaluated prior to induction Oxygen Delivery Method: Circle system utilized Preoxygenation: Pre-oxygenation with 100% oxygen Induction Type: IV induction, Rapid sequence and Cricoid Pressure applied Laryngoscope Size: Mac and 4 Grade View: Grade I Tube type: Oral Tube size: 7.5 mm Number of attempts: 1 Airway Equipment and Method: Stylet Placement Confirmation: ETT inserted through vocal cords under direct vision,  positive ETCO2 and breath sounds checked- equal and bilateral Secured at: 24 cm Tube secured with: Tape Dental Injury: Teeth and Oropharynx as per pre-operative assessment

## 2019-03-29 NOTE — Progress Notes (Addendum)
    CC:  Abdominal pain  Subjective: Still having some pain, still taking pain medicine.  No issues with clears yesterday.  We discussed surgery with him.  0Objective: Vital signs in last 24 hours: Temp:  [98.5 F (36.9 C)-99.5 F (37.5 C)] 98.5 F (36.9 C) (06/09 0505) Pulse Rate:  [99-113] 99 (06/09 0505) Resp:  [19-20] 19 (06/09 0505) BP: (120-135)/(65-83) 120/65 (06/09 0505) SpO2:  [94 %-96 %] 96 % (06/09 0505) Last BM Date: 03/25/19 480 PO 1829 IV Urine x 1 Afebrile, VSS WBC  17.2 CMP Latest Ref Rng & Units 03/29/2019 03/28/2019 03/27/2019  Total Bilirubin 0.3 - 1.2 mg/dL 2.6(H) 3.5(H) 4.1(H)  Alkaline Phos 38 - 126 U/L 101 117 156(H)  AST 15 - 41 U/L 43(H) 47(H) 131(H)  ALT 0 - 44 U/L 162(H) 232(H) 403(H)  Lipase 2679 >> 361 >> 174 >>88 (6/9)  Intake/Output from previous day: 06/08 0701 - 06/09 0700 In: 2509 [P.O.:480; I.V.:1829; IV Piggyback:200] Out: 1 [Urine:1] Intake/Output this shift: No intake/output data recorded.  General appearance: alert, cooperative and no distress Resp: clear to auscultation bilaterally GI: soft, still some midepigastric discomfort, not much now.  Some RLQ discomfort.  + BS,   Lab Results:  Recent Labs    03/28/19 0559 03/29/19 0532  WBC 17.1* 17.2*  HGB 15.1 14.3  HCT 48.6 44.6  PLT 169 238    BMET Recent Labs    03/28/19 0559 03/29/19 0532  NA 135 133*  K 3.8 3.7  CL 101 94*  CO2 26 30  GLUCOSE 122* 114*  BUN 12 9  CREATININE 0.75 0.79  CALCIUM 8.1* 8.2*   PT/INR No results for input(s): LABPROT, INR in the last 72 hours.  Recent Labs  Lab 03/25/19 2123 03/26/19 0552 03/27/19 0603 03/28/19 0559 03/29/19 0532  AST 281* 235* 131* 47* 43*  ALT 663* 575* 403* 232* 162*  ALKPHOS 253* 230* 156* 117 101  BILITOT 5.8* 3.1* 4.1* 3.5* 2.6*  PROT 8.3* 7.3 6.7 6.3* 6.7  ALBUMIN 4.7 3.9 3.5 3.1* 3.0*     Lipase     Component Value Date/Time   LIPASE 88 (H) 03/29/2019 0532     Medications: . acetaminophen   1,000 mg Oral On Call to OR  . bupivacaine liposome  20 mL Infiltration On Call to OR  . feeding supplement  1 Container Oral BID BM  . feeding supplement (PRO-STAT SUGAR FREE 64)  30 mL Oral BID  . gabapentin  300 mg Oral On Call to OR  . multivitamin with minerals  1 tablet Oral Daily  . pantoprazole (PROTONIX) IV  40 mg Intravenous Q24H  . polyethylene glycol  17 g Oral Daily    Assessment/Plan BMI 35.5 COVID - NEGATIVE  Gallstone pancreatitis/cholelithiasis  - MRCP w significnat pancreatic /peripancreatic inflammation/possible necrosis  - elevated LFT's  FEN: IV fluids/clear liquids >> NPO ID:  Rocephin x 1; Flagyl 6/6 x 2 doses;  6/6  Zosyn 6/6 >> day 3 DVT:  SCD's  - he can have DVT prophylaxis anticoagulation today from our standpoint hold after midnight Follow up:  TBD POC: Shadrick, Senne Mother   [phone]    Plan: for surgery later this AM with Dr. Rosendo Gros.         LOS: 2 days    Marcia Lepera 03/29/2019 [phone]

## 2019-03-29 NOTE — Anesthesia Preprocedure Evaluation (Signed)
Anesthesia Evaluation  Patient identified by MRN, date of birth, ID band Patient awake    Reviewed: Allergy & Precautions, NPO status , Patient's Chart, lab work & pertinent test results  Airway Mallampati: II  TM Distance: >3 FB Neck ROM: Full    Dental  (+) Dental Advisory Given   Pulmonary neg pulmonary ROS,    Pulmonary exam normal breath sounds clear to auscultation       Cardiovascular negative cardio ROS Normal cardiovascular exam Rhythm:Regular Rate:Normal     Neuro/Psych negative neurological ROS  negative psych ROS   GI/Hepatic negative GI ROS, Neg liver ROS,   Endo/Other  negative endocrine ROS  Renal/GU negative Renal ROS     Musculoskeletal negative musculoskeletal ROS (+)   Abdominal (+) + obese,   Peds  Hematology negative hematology ROS (+)   Anesthesia Other Findings   Reproductive/Obstetrics                             Anesthesia Physical Anesthesia Plan  ASA: II  Anesthesia Plan: General   Post-op Pain Management:    Induction: Intravenous  PONV Risk Score and Plan: 4 or greater and Ondansetron, Dexamethasone, Midazolam and Scopolamine patch - Pre-op  Airway Management Planned: Oral ETT  Additional Equipment: None  Intra-op Plan:   Post-operative Plan: Extubation in OR  Informed Consent: I have reviewed the patients History and Physical, chart, labs and discussed the procedure including the risks, benefits and alternatives for the proposed anesthesia with the patient or authorized representative who has indicated his/her understanding and acceptance.     Dental advisory given  Plan Discussed with: CRNA  Anesthesia Plan Comments:         Anesthesia Quick Evaluation

## 2019-03-29 NOTE — Discharge Instructions (Signed)
[facility] ______[facility] °LAPAROSCOPIC SURGERY: POST OP INSTRUCTIONS °Always review your discharge instruction sheet given to you by the facility where your surgery was performed. °IF YOU HAVE DISABILITY OR FAMILY LEAVE FORMS, YOU MUST BRING THEM TO THE OFFICE FOR PROCESSING.   °DO NOT GIVE THEM TO YOUR DOCTOR. ° °1. A prescription for pain medication may be given to you upon discharge.  Take your pain medication as prescribed, if needed.  If narcotic pain medicine is not needed, then you may take acetaminophen (Tylenol) or ibuprofen (Advil) as needed. °2. Take your usually prescribed medications unless otherwise directed. °3. If you need a refill on your pain medication, please contact your pharmacy.  They will contact our office to request authorization. Prescriptions will not be filled after 5pm or on week-ends. °4. You should follow a light diet the first few days after arrival home, such as soup and crackers, etc.  Be sure to include lots of fluids daily. °5. Most patients will experience some swelling and bruising in the area of the incisions.  Ice packs will help.  Swelling and bruising can take several days to resolve.  °6. It is common to experience some constipation if taking pain medication after surgery.  Increasing fluid intake and taking a stool softener (such as Colace) will usually help or prevent this problem from occurring.  A mild laxative (Milk of Magnesia or Miralax) should be taken according to package instructions if there are no bowel movements after 48 hours. °7. Unless discharge instructions indicate otherwise, you may remove your bandages 24-48 hours after surgery, and you may shower at that time.  You may have steri-strips (small skin tapes) in place directly over the incision.  These strips should be left on the skin for 7-10 days.  If your surgeon used skin glue on the incision, you may shower in 24 hours.  The glue will flake off over the next 2-3 weeks.  Any sutures or  staples will be removed at the office during your follow-up visit. °8. ACTIVITIES:  You may resume regular (light) daily activities beginning the next day--such as daily self-care, walking, climbing stairs--gradually increasing activities as tolerated.  You may have sexual intercourse when it is comfortable.  Refrain from any heavy lifting or straining until approved by your doctor. °a. You may drive when you are no longer taking prescription pain medication, you can comfortably wear a seatbelt, and you can safely maneuver your car and apply brakes. °b. RETURN TO WORK:  __________________________________________________________ °9. You should see your doctor in the office for a follow-up appointment approximately 2-3 weeks after your surgery.  Make sure that you call for this appointment within a day or two after you arrive home to insure a convenient appointment time. °10. OTHER INSTRUCTIONS: __________________________________________________________________________________________________________________________ __________________________________________________________________________________________________________________________ °WHEN TO CALL YOUR DOCTOR: °1. Fever over 101.0 °2. Inability to urinate °3. Continued bleeding from incision. °4. Increased pain, redness, or drainage from the incision. °5. Increasing abdominal pain ° °The clinic staff is available to answer your questions during regular business hours.  Please don’t hesitate to call and ask to speak to one of the nurses for clinical concerns.  If you have a medical emergency, go to the nearest emergency room or call 911.  A surgeon from [facility] is always on call at the hospital. °[address], [address], [place], [place]  [address] ? [address] [address], [place],    [address] °[phone] ? [phone] ? FAX [phone] °Web site:   [email or web address]   Gallbladder Eating Plan If you have a gallbladder  condition, you may have trouble digesting fats. Eating a low-fat diet can help reduce your symptoms, and may be helpful before and after having surgery to remove your gallbladder (cholecystectomy). Your health care provider may recommend that you work with a diet and nutrition specialist (dietitian) to help you reduce the amount of fat in your diet. What are tips for following this plan? General guidelines  Limit your fat intake to less than 30% of your total daily calories. If you eat around 1,800 calories each day, this is less than 60 grams (g) of fat per day.  Fat is an important part of a healthy diet. Eating a low-fat diet can make it hard to maintain a healthy body weight. Ask your dietitian how much fat, calories, and other nutrients you need each day.  Eat small, frequent meals throughout the day instead of three large meals.  Drink at least 8-10 cups of fluid a day. Drink enough fluid to keep your urine clear or pale yellow.  Limit alcohol intake to no more than 1 drink a day for nonpregnant women and 2 drinks a day for men. One drink equals 12 oz of beer, 5 oz of wine, or 1 oz of hard liquor. Reading food labels  Check Nutrition Facts on food labels for the amount of fat per serving. Choose foods with less than 3 grams of fat per serving. Shopping  Choose nonfat and low-fat healthy foods. Look for the words nonfat, low fat, or fat free.  Avoid buying processed or prepackaged foods. Cooking  Cook using low-fat methods, such as baking, broiling, grilling, or boiling.  Cook with small amounts of healthy fats, such as olive oil, grapeseed oil, canola oil, or sunflower oil. What foods are recommended?   All fresh, frozen, or canned fruits and vegetables.  Whole grains.  Low-fat or non-fat (skim) milk and yogurt.  Lean meat, skinless poultry, fish, eggs, and beans.  Low-fat protein supplement powders or drinks.  Spices and herbs. What foods are not  recommended?  High-fat foods. These include baked goods, fast food, fatty cuts of meat, ice cream, french toast, sweet rolls, pizza, cheese bread, foods covered with butter, creamy sauces, or cheese.  Fried foods. These include french fries, tempura, battered fish, breaded chicken, fried breads, and sweets.  Foods with strong odors.  Foods that cause bloating and gas. Summary  A low-fat diet can be helpful if you have a gallbladder condition, or before and after gallbladder surgery.  Limit your fat intake to less than 30% of your total daily calories. This is about 60 g of fat if you eat 1,800 calories each day.  Eat small, frequent meals throughout the day instead of three large meals. This information is not intended to replace advice given to you by your health care provider. Make sure you discuss any questions you have with your health care provider. Document Released: 10/11/2013 Document Revised: 11/13/2016 Document Reviewed: 11/13/2016 Elsevier Interactive Patient Education  2019 [employer].

## 2019-03-29 NOTE — Progress Notes (Signed)
PROGRESS NOTE    Benjamin SkainsDerek Gregory  FAO:[id number] DOB: 06/17/1986 DOA: 03/25/2019 PCP: Patient, No Pcp Per   Brief Narrative: Patient is a 33 year old male with history of cholelithiasis who presented with abdominal pain, vomiting. Patient was having on and off abdominal pain in the past.   He was having severe epigastric abdominal pain yesterday with nausea and vomiting.  When he presented to the emergency department he had elevated white cell counts, lipase, liver enzymes.  Right upper quadrant ultrasound showed cholelithiasis without signs of acute cholecystitis.  Also showed dilated common bile duct suspicious for underlying choledocholithiasis. 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 GI and general surgery following. Undergoing laparoscopic cholecystectomy today.  Assessment & Plan:   Principal Problem:   Choledocholithiasis Active Problems:   Gall Nokes pancreatitis   Encounter for screening for HIV   Severe pancreatitis with pancreatic necrosis: MRCP as above.  General surgery and GI following.  Continue IV fluids.   Continue pain management.  Liver enzymes, lipase have improved. Continue IV antibiotics because of pancreatic necrosis.On meropenem. Patient says his abdomen pain has improved.  Still has some tenderness in the epigastric region.  Has not passed any flatus, does not have a bowel movement yet.  Cholelithiasis: Plan for cholecystectomy as per general surgery today.  Nutrition Problem: Inadequate protein intake Etiology: other (see comment)(current diet order)      DVT prophylaxis:SCDs Code Status: Full Family Communication: None present at the bedside Disposition Plan: Home after complete resolution of pancreatitis   Consultants: GI, general surgery  Procedures: MRCP  Antimicrobials:  Anti-infectives (From admission, onward)   Start      Dose/Rate Route Frequency Ordered Stop   03/29/19 0600  cefTRIAXone (ROCEPHIN) 2 g in sodium chloride 0.9 % 100 mL IVPB     2 g 200 mL/hr over 30 Minutes Intravenous On call to O.R. 03/28/19 1302 03/30/19 0559   03/29/19 0600  metroNIDAZOLE (FLAGYL) IVPB 500 mg     500 mg 100 mL/hr over 60 Minutes Intravenous On call to O.R. 03/28/19 1302 03/30/19 0559   03/28/19 1030  [MAR Hold]  meropenem (MERREM) 1 g in sodium chloride 0.9 % 100 mL IVPB     (MAR Hold since Tue 03/29/2019 at 1138. Reason: Transfer to a Procedural area.)   1 g 200 mL/hr over 30 Minutes Intravenous Every 8 hours 03/28/19 0904     03/26/19 2200  cefTRIAXone (ROCEPHIN) 2 g in sodium chloride 0.9 % 100 mL IVPB  Status:  Discontinued     2 g 200 mL/hr over 30 Minutes Intravenous Every 24 hours 03/26/19 0125 03/26/19 1220   03/26/19 1400  piperacillin-tazobactam (ZOSYN) IVPB 3.375 g  Status:  Discontinued     3.375 g 12.5 mL/hr over 240 Minutes Intravenous Every 8 hours 03/26/19 1242 03/28/19 0904   03/26/19 0145  metroNIDAZOLE (FLAGYL) IVPB 500 mg  Status:  Discontinued     500 mg 100 mL/hr over 60 Minutes Intravenous Every 8 hours 03/26/19 0125 03/26/19 1220   03/26/19 0145  piperacillin-tazobactam (ZOSYN) IVPB 3.375 g     3.375 g 100 mL/hr over 30 Minutes Intravenous  Once 03/26/19 0132 03/26/19 0333   03/26/19 0030  cefTRIAXone (ROCEPHIN) 2 g in sodium chloride 0.9 % 100 mL IVPB     2 g 200 mL/hr over 30 Minutes Intravenous  Once 03/26/19 0020 03/26/19 0204      Subjective: Patient seen and examined  the bedside this morning.  Abdomen pain has improved today.  Still has some pain on the epigastric region.  Has not passed any flatus or stool.  Objective: Vitals:   03/28/19 2059 03/29/19 0505 03/29/19 1140 03/29/19 1156  BP: 131/83 120/65 132/69   Pulse: (!) 113 99 (!) 102   Resp: 19 19 20    Temp: 99.5 F (37.5 C) 98.5 F (36.9 C) 99 F (37.2 C)   TempSrc: Oral Oral Oral   SpO2: 94% 96% 95%   Weight:    115.7 kg   Height:    5\' 11"  (1.803 m)    Intake/Output Summary (Last 24 hours) at 03/29/2019 1323 Last data filed at 03/29/2019 0200 Gross per 24 hour  Intake 2268.99 ml  Output -  Net 2268.99 ml   Filed Weights   03/25/19 2056 03/29/19 1156  Weight: 115.7 kg 115.7 kg    Examination:  General exam: Appears calm and comfortable ,Not in distress,obese HEENT:PERRL,Oral mucosa moist, Ear/Nose normal on gross exam Respiratory system: Bilateral equal air entry, normal vesicular breath sounds, no wheezes or crackles  Cardiovascular system: S1 & S2 heard, RRR. No JVD, murmurs, rubs, gallops or clicks. Gastrointestinal system: Abdomen is nondistended, soft .  Tenderness on the epigastric region.  No organomegaly or masses felt. Bowel sounds sluggish Central nervous system: Alert and oriented. No focal neurological deficits. Extremities: No edema, no clubbing ,no cyanosis, distal peripheral pulses palpable. Skin: No rashes, lesions or ulcers,no icterus ,no pallor MSK: Normal muscle bulk,tone ,power Psychiatry: Judgement and insight appear normal. Mood & affect appropriate.      Data Reviewed: I have personally reviewed following labs and imaging studies  CBC: Recent Labs  Lab 03/25/19 2123 03/26/19 0552 03/27/19 0603 03/28/19 0559 03/29/19 0532  WBC 15.9* 16.4* 24.4* 17.1* 17.2*  NEUTROABS  --  15.3* 22.2* 15.1*  --   HGB 15.4 15.7 16.9 15.1 14.3  HCT 47.6 48.5 51.6 48.6 44.6  MCV 95.2 95.1 94.3 96.6 96.5  PLT 250 257 236 169 824   Basic Metabolic Panel: Recent Labs  Lab 03/25/19 2123 03/27/19 0603 03/28/19 0559 03/29/19 0532  NA 141 135 135 133*  K 4.4 4.0 3.8 3.7  CL 102 101 101 94*  CO2 27 25 26 30   GLUCOSE 183* 122* 122* 114*  BUN 12 11 12 9   CREATININE 0.93 0.83 0.75 0.79  CALCIUM 9.7 8.3* 8.1* 8.2*   GFR: Estimated Creatinine Clearance: 174.7 mL/min (by C-G formula based on SCr of 0.79 mg/dL). Liver Function Tests: Recent Labs  Lab 03/25/19 2123 03/26/19 0552  03/27/19 0603 03/28/19 0559 03/29/19 0532  AST 281* 235* 131* 47* 43*  ALT 663* 575* 403* 232* 162*  ALKPHOS 253* 230* 156* 117 101  BILITOT 5.8* 3.1* 4.1* 3.5* 2.6*  PROT 8.3* 7.3 6.7 6.3* 6.7  ALBUMIN 4.7 3.9 3.5 3.1* 3.0*   Recent Labs  Lab 03/25/19 2123 03/27/19 0603 03/28/19 0559 03/28/19 0729 03/29/19 0532  LIPASE 2,353* 614* 431* DUPLICATE REQUEST 88*   No results for input(s): AMMONIA in the last 168 hours. Coagulation Profile: No results for input(s): INR, PROTIME in the last 168 hours. Cardiac Enzymes: No results for input(s): CKTOTAL, CKMB, CKMBINDEX, TROPONINI in the last 168 hours. BNP (last 3 results) No results for input(s): PROBNP in the last 8760 hours. HbA1C: No results for input(s): HGBA1C in the last 72 hours. CBG: No results for input(s): GLUCAP in the last 168 hours. Lipid Profile: Recent Labs    03/27/19 [phone]  TRIG 60   Thyroid Function Tests: No results for input(s): TSH, T4TOTAL, FREET4, T3FREE, THYROIDAB in the last 72 hours. Anemia Panel: No results for input(s): VITAMINB12, FOLATE, FERRITIN, TIBC, IRON, RETICCTPCT in the last 72 hours. Sepsis Labs: No results for input(s): PROCALCITON, LATICACIDVEN in the last 168 hours.  Recent Results (from the past 240 hour(s))  SARS Coronavirus 2 (CEPHEID - Performed in [facility] hospital lab), Hosp Order     Status: None   Collection Time: 03/25/19 11:50 PM  Result Value Ref Range Status   SARS Coronavirus 2 NEGATIVE NEGATIVE Final    Comment: (NOTE) If result is NEGATIVE SARS-CoV-2 target nucleic acids are NOT DETECTED. The SARS-CoV-2 RNA is generally detectable in upper and lower  respiratory specimens during the acute phase of infection. The lowest  concentration of SARS-CoV-2 viral copies this assay can detect is 250  copies / mL. A negative result does not preclude SARS-CoV-2 infection  and should not be used as the sole basis for treatment or other  patient management decisions.  A  negative result may occur with  improper specimen collection / handling, submission of specimen other  than nasopharyngeal swab, presence of viral mutation(s) within the  areas targeted by this assay, and inadequate number of viral copies  (<250 copies / mL). A negative result must be combined with clinical  observations, patient history, and epidemiological information. If result is POSITIVE SARS-CoV-2 target nucleic acids are DETECTED. The SARS-CoV-2 RNA is generally detectable in upper and lower  respiratory specimens dur ing the acute phase of infection.  Positive  results are indicative of active infection with SARS-CoV-2.  Clinical  correlation with patient history and other diagnostic information is  necessary to determine patient infection status.  Positive results do  not rule out bacterial infection or co-infection with other viruses. If result is PRESUMPTIVE POSTIVE SARS-CoV-2 nucleic acids MAY BE PRESENT.   A presumptive positive result was obtained on the submitted specimen  and confirmed on repeat testing.  While 2019 novel coronavirus  (SARS-CoV-2) nucleic acids may be present in the submitted sample  additional confirmatory testing may be necessary for epidemiological  and / or clinical management purposes  to differentiate between  SARS-CoV-2 and other Sarbecovirus currently known to infect humans.  If clinically indicated additional testing with an alternate test  methodology [phone]([id number]) is advised. The SARS-CoV-2 RNA is generally  detectable in upper and lower respiratory sp ecimens during the acute  phase of infection. The expected result is Negative. Fact Sheet for Patients:  BoilerBrush.com.cyhttps://www.fda.gov/media/136312/download Fact Sheet for Healthcare Providers: https://pope.com/https://www.fda.gov/media/136313/download This test is not yet approved or cleared by the [place] FDA and has been authorized for detection and/or diagnosis of SARS-CoV-2 by FDA under an Emergency Use  Authorization (EUA).  This EUA will remain in effect (meaning this test can be used) for the duration of the COVID-19 declaration under Section 564(b)(1) of the Act, 21 U.S.C. section 360bbb-3(b)(1), unless the authorization is terminated or revoked sooner. Performed at [facility], [address] [place], [place] [address]   SARS Coronavirus 2 (CEPHEID - Performed in [facility] hospital lab), Hosp Order     Status: None   Collection Time: 03/29/19 11:11 AM  Result Value Ref Range Status   SARS Coronavirus 2 NEGATIVE NEGATIVE Final    Comment: (NOTE) If result is NEGATIVE SARS-CoV-2 target nucleic acids are NOT DETECTED. The SARS-CoV-2 RNA is generally detectable in upper and lower  respiratory specimens during the acute phase  of infection. The lowest  concentration of SARS-CoV-2 viral copies this assay can detect is 250  copies / mL. A negative result does not preclude SARS-CoV-2 infection  and should not be used as the sole basis for treatment or other  patient management decisions.  A negative result may occur with  improper specimen collection / handling, submission of specimen other  than nasopharyngeal swab, presence of viral mutation(s) within the  areas targeted by this assay, and inadequate number of viral copies  (<250 copies / mL). A negative result must be combined with clinical  observations, patient history, and epidemiological information. If result is POSITIVE SARS-CoV-2 target nucleic acids are DETECTED. The SARS-CoV-2 RNA is generally detectable in upper and lower  respiratory specimens dur ing the acute phase of infection.  Positive  results are indicative of active infection with SARS-CoV-2.  Clinical  correlation with patient history and other diagnostic information is  necessary to determine patient infection status.  Positive results do  not rule out bacterial infection or co-infection with other viruses. If result is PRESUMPTIVE POSTIVE  SARS-CoV-2 nucleic acids MAY BE PRESENT.   A presumptive positive result was obtained on the submitted specimen  and confirmed on repeat testing.  While 2019 novel coronavirus  (SARS-CoV-2) nucleic acids may be present in the submitted sample  additional confirmatory testing may be necessary for epidemiological  and / or clinical management purposes  to differentiate between  SARS-CoV-2 and other Sarbecovirus currently known to infect humans.  If clinically indicated additional testing with an alternate test  methodology [phone]([id number]) is advised. The SARS-CoV-2 RNA is generally  detectable in upper and lower respiratory sp ecimens during the acute  phase of infection. The expected result is Negative. Fact Sheet for Patients:  BoilerBrush.com.cyhttps://www.fda.gov/media/136312/download Fact Sheet for Healthcare Providers: https://pope.com/https://www.fda.gov/media/136313/download This test is not yet approved or cleared by the [place] FDA and has been authorized for detection and/or diagnosis of SARS-CoV-2 by FDA under an Emergency Use Authorization (EUA).  This EUA will remain in effect (meaning this test can be used) for the duration of the COVID-19 declaration under Section 564(b)(1) of the Act, 21 U.S.C. section 360bbb-3(b)(1), unless the authorization is terminated or revoked sooner. Performed at [facility], [address] [place], [place] [address]          Radiology Studies: No results found.      Scheduled Meds: . bupivacaine liposome  20 mL Infiltration On Call to OR  . [MAR Hold] feeding supplement  1 Container Oral BID BM  . [MAR Hold] feeding supplement (PRO-STAT SUGAR FREE 64)  30 mL Oral BID  . [MAR Hold] multivitamin with minerals  1 tablet Oral Daily  . [MAR Hold] pantoprazole (PROTONIX) IV  40 mg Intravenous Q24H  . [MAR Hold] polyethylene glycol  17 g Oral Daily  . scopolamine       Continuous Infusions: . cefTRIAXone (ROCEPHIN)  IV     And  . metronidazole     . [MAR Hold] lactated ringers    . lactated ringers 125 mL/hr at 03/29/19 1205  . [MAR Hold] meropenem (MERREM) IV Stopped (03/28/19 2216)     LOS: 2 days    Time spent:35 mins. More than 50% of that time was spent in counseling and/or coordination of care.      Burnadette PopAmrit Ada Woodbury, MD [facility] Pager [phone]  If 7PM-7AM, please contact night-coverage [email or web address] Password [email or web address] 03/29/2019, 1:23 PM

## 2019-03-29 NOTE — Op Note (Signed)
03/29/2019  2:36 PM  PATIENT:  Benjamin Gregory  33 y.o. male  PRE-OPERATIVE DIAGNOSIS:  CHOLEDOCHOLITHIASIS  POST-OPERATIVE DIAGNOSIS:  CHOLEDOCHOLITHIASIS  PROCEDURE:  Procedure(s): LAPAROSCOPIC CHOLECYSTECTOMY WITH INTRAOPERATIVE CHOLANGIOGRAM (N/A)  SURGEON:  Surgeon(s) and Role:    Ralene Ok, MD - Primary  PHYSICIAN ASSISTANT: WIll Theodis Sato, was utilized to help with dissection and exposure.  ANESTHESIA:   local and general  EBL:  30 mL   BLOOD ADMINISTERED:none  DRAINS: none   LOCAL MEDICATIONS USED:  BUPIVICAINE   SPECIMEN:  Source of Specimen:  gallbladder  DISPOSITION OF SPECIMEN:  PATHOLOGY  COUNTS:  YES  TOURNIQUET:  * No tourniquets in log *  DICTATION: .Dragon Dictation The patient was taken to the operating and placed in the supine position with bilateral SCDs in place. The patient was prepped and draped in the usual sterile fashion. A time out was called and all facts were verified. A pneumoperitoneum was obtained via A Veress needle technique to a pressure of 22mm of mercury.  A 20mm trochar was then placed in the right upper quadrant under visualization, and there were no injuries to any abdominal organs. A 11 mm port was then placed in the umbilical region after infiltrating with local anesthesia under direct visualization. A second and third epigastric port and right lower quadrant port placement under direct visualization, respectively.  There was a distended colon and a 4th LUQ trochar was placed to help with retraction.  The gallbladder was identified and retracted, the peritoneum was then sharply dissected from the gallbladder and this dissection was carried down to Calot's triangle. The gallbladder was identified and stripped away circumferentially and seen going into the gallbladder 360, the critical angle was obtained. A Cook catheter was used to perform an intraoperative cholangiogram. The cholangiogram showed no filling defects and the  contrast emptied into the duodenum easily.  The hepatic ducts were seen to be free of any filling defects. 2 clips were placed proximally one distally and the cystic duct transected. The cystic artery was identified and 2 clips placed proximally and one distally and transected.   We then proceeded to remove the gallbladder off the hepatic fossa with Bovie cautery. A retrieval bag was then placed in the abdomen and gallbladder placed in the bag. The hepatic fossa was then reexamined and hemostasis was achieved with Bovie cautery and was excellent at the end of the case. The subhepatic fossa and perihepatic fossa was then irrigated until the effluent was clear. The 11 mm trocar fascia was reapproximated with the PMI & a #1 Vicryl x3. The pneumoperitoneum was evacuated and all trochars removed under direct visulalization. The skin was then closed with 4-0 Monocryl and the skin dressed with Dermabond. The patient was awaken from general anesthesia and taken to the recovery room in stable condition.    PLAN OF CARE: Admit to inpatient   PATIENT DISPOSITION:  PACU - hemodynamically stable.   Delay start of Pharmacological VTE agent (>24hrs) due to surgical blood loss or risk of bleeding: no

## 2019-03-30 ENCOUNTER — Encounter (HOSPITAL_COMMUNITY): Payer: Self-pay | Admitting: General Surgery

## 2019-03-30 ENCOUNTER — Other Ambulatory Visit: Payer: Self-pay

## 2019-03-30 ENCOUNTER — Telehealth: Payer: Self-pay

## 2019-03-30 DIAGNOSIS — K851 Biliary acute pancreatitis without necrosis or infection: Secondary | ICD-10-CM

## 2019-03-30 DIAGNOSIS — Z9049 Acquired absence of other specified parts of digestive tract: Secondary | ICD-10-CM

## 2019-03-30 DIAGNOSIS — K8689 Other specified diseases of pancreas: Secondary | ICD-10-CM

## 2019-03-30 LAB — CBC WITH DIFFERENTIAL/PLATELET
Abs Immature Granulocytes: 0.1 10*3/uL — ABNORMAL HIGH (ref 0.00–0.07)
Basophils Absolute: 0 10*3/uL (ref 0.0–0.1)
Basophils Relative: 0 %
Eosinophils Absolute: 0 10*3/uL (ref 0.0–0.5)
Eosinophils Relative: 0 %
HCT: 38.1 % — ABNORMAL LOW (ref 39.0–52.0)
Hemoglobin: 12.4 g/dL — ABNORMAL LOW (ref 13.0–17.0)
Immature Granulocytes: 1 %
Lymphocytes Relative: 6 %
Lymphs Abs: 0.9 10*3/uL (ref 0.7–4.0)
MCH: 31.1 pg (ref 26.0–34.0)
MCHC: 32.5 g/dL (ref 30.0–36.0)
MCV: 95.5 fL (ref 80.0–100.0)
Monocytes Absolute: 0.9 10*3/uL (ref 0.1–1.0)
Monocytes Relative: 6 %
Neutro Abs: 13.7 10*3/uL — ABNORMAL HIGH (ref 1.7–7.7)
Neutrophils Relative %: 87 %
Platelets: 237 10*3/uL (ref 150–400)
RBC: 3.99 MIL/uL — ABNORMAL LOW (ref 4.22–5.81)
RDW: 12.5 % (ref 11.5–15.5)
WBC: 15.5 10*3/uL — ABNORMAL HIGH (ref 4.0–10.5)
nRBC: 0 % (ref 0.0–0.2)

## 2019-03-30 LAB — HEPATITIS B SURFACE ANTIGEN: Hepatitis B Surface Ag: NEGATIVE

## 2019-03-30 LAB — COMPREHENSIVE METABOLIC PANEL
ALT: 132 U/L — ABNORMAL HIGH (ref 0–44)
AST: 52 U/L — ABNORMAL HIGH (ref 15–41)
Albumin: 2.8 g/dL — ABNORMAL LOW (ref 3.5–5.0)
Alkaline Phosphatase: 91 U/L (ref 38–126)
Anion gap: 8 (ref 5–15)
BUN: 12 mg/dL (ref 6–20)
CO2: 30 mmol/L (ref 22–32)
Calcium: 8 mg/dL — ABNORMAL LOW (ref 8.9–10.3)
Chloride: 98 mmol/L (ref 98–111)
Creatinine, Ser: 0.76 mg/dL (ref 0.61–1.24)
GFR calc Af Amer: >60 mL/min (ref >60)
GFR calc non Af Amer: >60 mL/min (ref >60)
Glucose, Bld: 140 mg/dL — ABNORMAL HIGH (ref 70–99)
Potassium: 3.6 mmol/L (ref 3.5–5.1)
Sodium: 136 mmol/L (ref 135–145)
Total Bilirubin: 1.9 mg/dL — ABNORMAL HIGH (ref 0.3–1.2)
Total Protein: 6.1 g/dL — ABNORMAL LOW (ref 6.5–8.1)

## 2019-03-30 LAB — HEPATITIS B CORE ANTIBODY, IGM: Hep B C IgM: NEGATIVE

## 2019-03-30 LAB — CALCIUM, IONIZED: Calcium, Ionized, Serum: 4.8 mg/dL (ref 4.5–5.6)

## 2019-03-30 LAB — HEPATITIS A ANTIBODY, IGM: Hep A IgM: NEGATIVE

## 2019-03-30 LAB — HEPATITIS B SURFACE ANTIBODY, QUANTITATIVE: Hep B S AB Quant (Post): <3.1 m[IU]/mL — ABNORMAL LOW (ref >9.9)

## 2019-03-30 LAB — HEPATITIS B CORE ANTIBODY, TOTAL: Hep B Core Total Ab: NEGATIVE

## 2019-03-30 LAB — HCV AB W REFLEX TO QUANT PCR: HCV Ab: <0.1 s/co ratio (ref 0.0–0.9)

## 2019-03-30 LAB — HCV INTERPRETATION

## 2019-03-30 MED ORDER — ACETAMINOPHEN 325 MG PO TABS: ORAL_TABLET | ORAL | Status: DC

## 2019-03-30 MED ORDER — AMOXICILLIN-POT CLAVULANATE 875-125 MG PO TABS: 1.0000 | ORAL_TABLET | Freq: Two times a day (BID) | ORAL | 0 refills | Status: AC

## 2019-03-30 MED ORDER — OXYCODONE HCL 5 MG PO TABS: 5.0000 mg | ORAL_TABLET | Freq: Four times a day (QID) | ORAL | 0 refills | Status: DC | PRN

## 2019-03-30 MED ORDER — OXYCODONE HCL 5 MG PO TABS: 5.0000 mg | ORAL_TABLET | ORAL | Status: DC | PRN

## 2019-03-30 MED ORDER — IBUPROFEN 200 MG PO TABS: ORAL_TABLET | ORAL | Status: DC

## 2019-03-30 NOTE — Discharge Summary (Signed)
Physician Discharge Summary  Benjamin SkainsDerek Gregory ZOX:[id number] DOB: 07/25/1986 DOA: 03/25/2019  PCP: Patient, No Pcp Per  Admit date: 03/25/2019 Discharge date: 03/30/2019  Admitted From: Home Disposition:  Home  Discharge Condition:Stable CODE STATUS:FULL Diet recommendation: Heart Healthy  Brief/Interim Summary: Patient is a 33 year old male with history of cholelithiasis who presented with abdominal pain, vomiting. Patient was having on and off abdominal pain in the past.  He was having severe epigastric abdominal pain yesterday with nausea and vomiting. When he presented to the emergency department he had elevated white cell counts, lipase, liver enzymes. Right upper quadrant ultrasound showed cholelithiasis without signs of acute cholecystitis. Also showed dilated common bile duct suspicious for underlying choledocholithiasis. 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 GI and general surgery following.  Started on meropenem.  His abdomen pain gradually improved. Underwent laparoscopic cholecystectomy on 03/29/2019. He is tolerating soft diet now.  GI and general surgery cleared him for discharge.  He is stable for discharge home today.  He will follow-up with general surgery and gastroenterology as outpatient  Following problems were discussed during his hospitalization:  Severe pancreatitis with pancreatic necrosis: MRCP as above.  General surgery and GI following.   Liver enzymes, lipase have improved. Was on  meropenem. Patient says his abdomen pain has improved.  Tolerating soft diet.  Antibiotics changed to oral.  He will follow-up with GI, general surgery as an outpatient.  Cholelithiasis: Underwent laparoscopic cholecystectomy.  Discharge Diagnoses:  Principal Problem:   Choledocholithiasis Active Problems:   Gall Dowen pancreatitis   Encounter for screening for  HIV    Discharge Instructions  Discharge Instructions    Diet - low sodium heart healthy   Complete by:  As directed    Discharge instructions   Complete by:  As directed    1)Take low fat diet.Avoid alcohol. 2)Follow up with a primary care physician in a week.  Do  CBC, CMP tests  during the follow-up 3)You will be called by gastroenterology for the follow-up. 4)Follow up with general surgery as an outpatient.Appointment has been scheduled.   Increase activity slowly   Complete by:  As directed      Allergies as of 03/30/2019   No Known Allergies     Medication List    TAKE these medications   acetaminophen 325 MG tablet Commonly known as:  TYLENOL You can take 3 tablets every 8 hours for pain.  This is your primary pain relief medication.  You can alternate this with plain ibuprofen. Do not take more than 4000 mg of Tylenol/acetaminophen per day it can harm your liver.   amoxicillin-clavulanate 875-125 MG tablet Commonly known as:  Augmentin Take 1 tablet by mouth 2 (two) times daily for 3 days.   ibuprofen 200 MG tablet Commonly known as:  ADVIL You may take 2 to 3 tablets every 6 hours as needed for pain.  You can buy this over-the-counter at any drugstore.  This is your second pain relief medication.  You can alternate this with plain Tylenol/acetaminophen.  You can buy this over-the-counter at any drugstore.   OVER THE COUNTER MEDICATION Take 1 tablet by mouth once.   oxyCODONE 5 MG immediate release tablet Commonly known as:  Oxy IR/ROXICODONE Take 1-2 tablets (5-10 mg total) by mouth every 6 (six) hours as needed for breakthrough pain (Pain not relieved by ibuprofen/Tylenol).      Follow-up Information    [facility] up on 04/14/2019.   Specialty:  General Surgery Why:  Your follow up will be a phone call appointment  (due to the [facility] virus, to limit exposure.) Hedda SladePuja Gosai will call you at 2:30 PM.  Please email a picture if you have an  concerns with you wound to : [email or web address] 06/25 at 2:30 pm.  Contact information: [address] [place] [address] [phone]          No Known Allergies  Consultations:  GI, general surgery   Procedures/Studies: Dg Cholangiogram Operative  Result Date: 03/29/2019 CLINICAL DATA:  Cholelithiasis EXAM: INTRAOPERATIVE CHOLANGIOGRAM TECHNIQUE: Cholangiographic images from the C-arm fluoroscopic device were submitted for interpretation post-operatively. Please see the procedural report for the amount of contrast and the fluoroscopy time utilized. COMPARISON:  03/26/2019 FINDINGS: Intraoperative cholangiogram performed during laparoscopic procedure. The residual cystic duct, biliary confluence, common hepatic, and common bile duct are patent. No dilatation, obstruction, stricture, or large filling defect. Contrast drains easily into the duodenum. IMPRESSION: Patent biliary system. Electronically Signed   By: Judie PetitM.  Shick M.D.   On: 03/29/2019 16:18   Mr 3d Recon At Scanner  Result Date: 03/26/2019 CLINICAL DATA:  Pancreatitis. Rule out choledocholithiasis. Abdominal pain and vomiting. EXAM: MRI ABDOMEN WITHOUT AND WITH CONTRAST (INCLUDING MRCP) TECHNIQUE: Multiplanar multisequence MR imaging of the abdomen was performed both before and after the administration of intravenous contrast. Heavily T2-weighted images of the biliary and pancreatic ducts were obtained, and three-dimensional MRCP images were rendered by post processing. CONTRAST:  10 cc Gadavist COMPARISON:  Ultrasound of 03/25/2019 FINDINGS: Lower chest: Trace left pleural fluid is likely physiologic. Normal heart size. Hepatobiliary: Normal liver. Multiple gallstones on the order of 3-4 mm. No intra or extrahepatic biliary duct dilatation. The common duct is normal in caliber, on the order of 4-5 mm. No choledocholithiasis. Pancreas: Marked pancreatic and peripancreatic edema with fluid throughout the  upper abdomen. Pancreatic necrosis involving the body, including on image 51/1103 and 47/11102. No well-circumscribed peripancreatic fluid collection. Spleen:  Normal in size, without focal abnormality. Adrenals/Urinary Tract: Normal adrenal glands. Normal kidneys, without hydronephrosis. Stomach/Bowel: Normal stomach. The descending duodenum is thick walled, including on 64/1100 3. Vascular/Lymphatic: Normal caliber of the aorta and branch vessels. Patent portal and splenic veins. No abdominal adenopathy. Other:  Small volume intraperitoneal fluid, likely secondary. Musculoskeletal: No acute osseous abnormality. IMPRESSION: 1. Cholelithiasis, but no biliary duct dilatation or evidence of choledocholithiasis. 2. 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 3. Duodenal wall thickening, likely related to secondary duodenitis. Electronically Signed   By: Jeronimo GreavesKyle  Talbot M.D.   On: 03/26/2019 14:42   [place] Abdomen Limited  Result Date: 03/25/2019 CLINICAL DATA:  Abdominal pain EXAM: ULTRASOUND ABDOMEN LIMITED RIGHT UPPER QUADRANT COMPARISON:  None. FINDINGS: Gallbladder: Multiple gallstones are noted. The sonographic Eulah PontMurphy sign is negative. There is no evidence of gallbladder wall thickening. There is gallbladder sludge. Common bile duct: Diameter: 0.9 cm. Liver: Diffuse increased echogenicity with slightly heterogeneous liver. Appearance typically secondary to fatty infiltration. Fibrosis secondary consideration. No secondary findings of cirrhosis noted. No focal hepatic lesion or intrahepatic biliary duct dilatation. Portal vein is patent on color Doppler imaging with normal direction of blood flow towards the liver. IMPRESSION: 1. There is cholelithiasis without secondary signs of acute cholecystitis. 2. Dilated common bile duct suspicious for underlying choledocholithiasis. Follow-up with MRCP/ERCP is recommended. 3. Diffuse increased echogenicity with slightly heterogeneous liver. Appearance  typically secondary to fatty infiltration. Fibrosis secondary consideration. No  secondary findings of cirrhosis noted. No focal hepatic lesion or intrahepatic biliary duct dilatation. Electronically Signed   By: Katherine Mantle M.D.   On: 03/25/2019 23:55   Mr Abdomen Mrcp Vivien Rossetti Contast  Result Date: 03/26/2019 CLINICAL DATA:  Pancreatitis. Rule out choledocholithiasis. Abdominal pain and vomiting. EXAM: MRI ABDOMEN WITHOUT AND WITH CONTRAST (INCLUDING MRCP) TECHNIQUE: Multiplanar multisequence MR imaging of the abdomen was performed both before and after the administration of intravenous contrast. Heavily T2-weighted images of the biliary and pancreatic ducts were obtained, and three-dimensional MRCP images were rendered by post processing. CONTRAST:  10 cc Gadavist COMPARISON:  Ultrasound of 03/25/2019 FINDINGS: Lower chest: Trace left pleural fluid is likely physiologic. Normal heart size. Hepatobiliary: Normal liver. Multiple gallstones on the order of 3-4 mm. No intra or extrahepatic biliary duct dilatation. The common duct is normal in caliber, on the order of 4-5 mm. No choledocholithiasis. Pancreas: Marked pancreatic and peripancreatic edema with fluid throughout the upper abdomen. Pancreatic necrosis involving the body, including on image 51/1103 and 47/11102. No well-circumscribed peripancreatic fluid collection. Spleen:  Normal in size, without focal abnormality. Adrenals/Urinary Tract: Normal adrenal glands. Normal kidneys, without hydronephrosis. Stomach/Bowel: Normal stomach. The descending duodenum is thick walled, including on 64/1100 3. Vascular/Lymphatic: Normal caliber of the aorta and branch vessels. Patent portal and splenic veins. No abdominal adenopathy. Other:  Small volume intraperitoneal fluid, likely secondary. Musculoskeletal: No acute osseous abnormality. IMPRESSION: 1. Cholelithiasis, but no biliary duct dilatation or evidence of choledocholithiasis. 2. Marked pancreatitis with  pancreatic necrosis involving the body. 3. Duodenal wall thickening, likely related to secondary duodenitis. Electronically Signed   By: Jeronimo Greaves M.D.   On: 03/26/2019 14:42       Subjective: Patient seen and examined the bedside this morning.  Remains comfortable.  Hemodynamically stable for discharge.  Discharge Exam: Vitals:   03/30/19 0935 03/30/19 1312  BP: 101/73 112/68  Pulse: 77 65  Resp: 18 18  Temp: 98.6 F (37 C) 98.3 F (36.8 C)  SpO2: 96% 95%   Vitals:   03/30/19 0223 03/30/19 0526 03/30/19 0935 03/30/19 1312  BP: 109/61 95/67 101/73 112/68  Pulse: (!) 57 66 77 65  Resp: Temp: 97.9 F (36.6 C) 98.7 F (37.1 C) 98.6 F (37 C) 98.3 F (36.8 C)  TempSrc: Oral Oral Oral Oral  SpO2: 94% 96% 96% 95%  Weight:      Height:        General: Pt is alert, awake, not in acute distress Cardiovascular: RRR, S1/S2 +, no rubs, no gallops Respiratory: CTA bilaterally, no wheezing, no rhonchi Abdominal: Soft, NT, ND, bowel sounds + Extremities: no edema, no cyanosis    The results of significant diagnostics from this hospitalization (including imaging, microbiology, ancillary and laboratory) are listed below for reference.     Microbiology: Recent Results (from the past 240 hour(s))  SARS Coronavirus 2 (CEPHEID - Performed in [facility] hospital lab), Hosp Order     Status: None   Collection Time: 03/25/19 11:50 PM  Result Value Ref Range Status   SARS Coronavirus 2 NEGATIVE NEGATIVE Final    Comment: (NOTE) If result is NEGATIVE SARS-CoV-2 target nucleic acids are NOT DETECTED. The SARS-CoV-2 RNA is generally detectable in upper and lower  respiratory specimens during the acute phase of infection. The lowest  concentration of SARS-CoV-2 viral copies this assay can detect is 250  copies / mL. A negative result does not preclude SARS-CoV-2 infection  and should not  be used as the sole basis for treatment or other  patient management decisions.   A negative result may occur with  improper specimen collection / handling, submission of specimen other  than nasopharyngeal swab, presence of viral mutation(s) within the  areas targeted by this assay, and inadequate number of viral copies  (<250 copies / mL). A negative result must be combined with clinical  observations, patient history, and epidemiological information. If result is POSITIVE SARS-CoV-2 target nucleic acids are DETECTED. The SARS-CoV-2 RNA is generally detectable in upper and lower  respiratory specimens dur ing the acute phase of infection.  Positive  results are indicative of active infection with SARS-CoV-2.  Clinical  correlation with patient history and other diagnostic information is  necessary to determine patient infection status.  Positive results do  not rule out bacterial infection or co-infection with other viruses. If result is PRESUMPTIVE POSTIVE SARS-CoV-2 nucleic acids MAY BE PRESENT.   A presumptive positive result was obtained on the submitted specimen  and confirmed on repeat testing.  While 2019 novel coronavirus  (SARS-CoV-2) nucleic acids may be present in the submitted sample  additional confirmatory testing may be necessary for epidemiological  and / or clinical management purposes  to differentiate between  SARS-CoV-2 and other Sarbecovirus currently known to infect humans.  If clinically indicated additional testing with an alternate test  methodology [phone]) is advised. The SARS-CoV-2 RNA is generally  detectable in upper and lower respiratory sp ecimens during the acute  phase of infection. The expected result is Negative. Fact Sheet for Patients:  BoilerBrush.com.cy Fact Sheet for Healthcare Providers: https://pope.com/ This test is not yet approved or cleared by the [place] FDA and has been authorized for detection and/or diagnosis of SARS-CoV-2 by FDA under an Emergency Use  Authorization (EUA).  This EUA will remain in effect (meaning this test can be used) for the duration of the COVID-19 declaration under Section 564(b)(1) of the Act, 21 U.S.C. section 360bbb-3(b)(1), unless the authorization is terminated or revoked sooner. Performed at [facility], [address], [place], [place] [address]   SARS Coronavirus 2 (CEPHEID - Performed in [facility] hospital lab), Hosp Order     Status: None   Collection Time: 03/29/19 11:11 AM  Result Value Ref Range Status   SARS Coronavirus 2 NEGATIVE NEGATIVE Final    Comment: (NOTE) If result is NEGATIVE SARS-CoV-2 target nucleic acids are NOT DETECTED. The SARS-CoV-2 RNA is generally detectable in upper and lower  respiratory specimens during the acute phase of infection. The lowest  concentration of SARS-CoV-2 viral copies this assay can detect is 250  copies / mL. A negative result does not preclude SARS-CoV-2 infection  and should not be used as the sole basis for treatment or other  patient management decisions.  A negative result may occur with  improper specimen collection / handling, submission of specimen other  than nasopharyngeal swab, presence of viral mutation(s) within the  areas targeted by this assay, and inadequate number of viral copies  (<250 copies / mL). A negative result must be combined with clinical  observations, patient history, and epidemiological information. If result is POSITIVE SARS-CoV-2 target nucleic acids are DETECTED. The SARS-CoV-2 RNA is generally detectable in upper and lower  respiratory specimens dur ing the acute phase of infection.  Positive  results are indicative of active infection with SARS-CoV-2.  Clinical  correlation with patient history and other diagnostic information is  necessary to determine patient infection status.  Positive results do  not rule out bacterial infection or co-infection with other viruses. If result is PRESUMPTIVE  POSTIVE SARS-CoV-2 nucleic acids MAY BE PRESENT.   A presumptive positive result was obtained on the submitted specimen  and confirmed on repeat testing.  While 2019 novel coronavirus  (SARS-CoV-2) nucleic acids may be present in the submitted sample  additional confirmatory testing may be necessary for epidemiological  and / or clinical management purposes  to differentiate between  SARS-CoV-2 and other Sarbecovirus currently known to infect humans.  If clinically indicated additional testing with an alternate test  methodology [phone]) is advised. The SARS-CoV-2 RNA is generally  detectable in upper and lower respiratory sp ecimens during the acute  phase of infection. The expected result is Negative. Fact Sheet for Patients:  StrictlyIdeas.no Fact Sheet for Healthcare Providers: BankingDealers.co.za This test is not yet approved or cleared by the [place] FDA and has been authorized for detection and/or diagnosis of SARS-CoV-2 by FDA under an Emergency Use Authorization (EUA).  This EUA will remain in effect (meaning this test can be used) for the duration of the COVID-19 declaration under Section 564(b)(1) of the Act, 21 U.S.C. section 360bbb-3(b)(1), unless the authorization is terminated or revoked sooner. Performed at [facility], [place] [address] [place], [place] [address]      Labs: BNP (last 3 results) No results for input(s): BNP in the last 8760 hours. Basic Metabolic Panel: Recent Labs  Lab 03/25/19 2123 03/27/19 0603 03/28/19 0559 03/29/19 0532 03/30/19 0929  NA 141 135 135 133* 136  K 4.4 4.0 3.8 3.7 3.6  CL 102 101 101 94* 98  CO2 27 25 26 30 30   GLUCOSE 183* 122* 122* 114* 140*  BUN 12 11 12 9 12   CREATININE 0.93 0.83 0.75 0.79 0.76  CALCIUM 9.7 8.3* 8.1* 8.2* 8.0*   Liver Function Tests: Recent Labs  Lab 03/26/19 0552 03/27/19 0603 03/28/19 0559 03/29/19 0532 03/30/19 0929   AST 235* 131* 47* 43* 52*  ALT 575* 403* 232* 162* 132*  ALKPHOS 230* 156* 117 101 91  BILITOT 3.1* 4.1* 3.5* 2.6* 1.9*  PROT 7.3 6.7 6.3* 6.7 6.1*  ALBUMIN 3.9 3.5 3.1* 3.0* 2.8*   Recent Labs  Lab 03/25/19 2123 03/27/19 0603 03/28/19 0559 03/28/19 0729 03/29/19 0532  LIPASE 6,144* 315* 400* DUPLICATE REQUEST 88*   No results for input(s): AMMONIA in the last 168 hours. CBC: Recent Labs  Lab 03/26/19 0552 03/27/19 0603 03/28/19 0559 03/29/19 0532 03/30/19 0929  WBC 16.4* 24.4* 17.1* 17.2* 15.5*  NEUTROABS 15.3* 22.2* 15.1*  --  13.7*  HGB 15.7 16.9 15.1 14.3 12.4*  HCT 48.5 51.6 48.6 44.6 38.1*  MCV 95.1 94.3 96.6 96.5 95.5  PLT 257 236 169 238 237   Cardiac Enzymes: No results for input(s): CKTOTAL, CKMB, CKMBINDEX, TROPONINI in the last 168 hours. BNP: Invalid input(s): POCBNP CBG: No results for input(s): GLUCAP in the last 168 hours. D-Dimer No results for input(s): DDIMER in the last 72 hours. Hgb A1c No results for input(s): HGBA1C in the last 72 hours. Lipid Profile No results for input(s): CHOL, HDL, LDLCALC, TRIG, CHOLHDL, LDLDIRECT in the last 72 hours. Thyroid function studies No results for input(s): TSH, T4TOTAL, T3FREE, THYROIDAB in the last 72 hours.  Invalid input(s): FREET3 Anemia work up No results for input(s): VITAMINB12, FOLATE, FERRITIN, TIBC, IRON, RETICCTPCT in the last 72 hours. Urinalysis    Component Value Date/Time   COLORURINE AMBER (A) 03/26/2019 0503  APPEARANCEUR CLOUDY (A) 03/26/2019 0503   LABSPEC 1.032 (H) 03/26/2019 0503   PHURINE 5.0 03/26/2019 0503   GLUCOSEU 50 (A) 03/26/2019 0503   HGBUR NEGATIVE 03/26/2019 0503   BILIRUBINUR SMALL (A) 03/26/2019 0503   KETONESUR 20 (A) 03/26/2019 0503   PROTEINUR 30 (A) 03/26/2019 0503   NITRITE NEGATIVE 03/26/2019 0503   LEUKOCYTESUR NEGATIVE 03/26/2019 0503   Sepsis Labs Invalid input(s): PROCALCITONIN,  WBC,  LACTICIDVEN Microbiology Recent Results (from the past 240  hour(s))  SARS Coronavirus 2 (CEPHEID - Performed in [facility] lab), Hosp Order     Status: None   Collection Time: 03/25/19 11:50 PM  Result Value Ref Range Status   SARS Coronavirus 2 NEGATIVE NEGATIVE Final    Comment: (NOTE) If result is NEGATIVE SARS-CoV-2 target nucleic acids are NOT DETECTED. The SARS-CoV-2 RNA is generally detectable in upper and lower  respiratory specimens during the acute phase of infection. The lowest  concentration of SARS-CoV-2 viral copies this assay can detect is 250  copies / mL. A negative result does not preclude SARS-CoV-2 infection  and should not be used as the sole basis for treatment or other  patient management decisions.  A negative result may occur with  improper specimen collection / handling, submission of specimen other  than nasopharyngeal swab, presence of viral mutation(s) within the  areas targeted by this assay, and inadequate number of viral copies  (<250 copies / mL). A negative result must be combined with clinical  observations, patient history, and epidemiological information. If result is POSITIVE SARS-CoV-2 target nucleic acids are DETECTED. The SARS-CoV-2 RNA is generally detectable in upper and lower  respiratory specimens dur ing the acute phase of infection.  Positive  results are indicative of active infection with SARS-CoV-2.  Clinical  correlation with patient history and other diagnostic information is  necessary to determine patient infection status.  Positive results do  not rule out bacterial infection or co-infection with other viruses. If result is PRESUMPTIVE POSTIVE SARS-CoV-2 nucleic acids MAY BE PRESENT.   A presumptive positive result was obtained on the submitted specimen  and confirmed on repeat testing.  While 2019 novel coronavirus  (SARS-CoV-2) nucleic acids may be present in the submitted sample  additional confirmatory testing may be necessary for epidemiological  and / or clinical  management purposes  to differentiate between  SARS-CoV-2 and other Sarbecovirus currently known to infect humans.  If clinically indicated additional testing with an alternate test  methodology [phone]) is advised. The SARS-CoV-2 RNA is generally  detectable in upper and lower respiratory sp ecimens during the acute  phase of infection. The expected result is Negative. Fact Sheet for Patients:  BoilerBrush.com.cyhttps://www.fda.gov/media/136312/download Fact Sheet for Healthcare Providers: https://pope.com/https://www.fda.gov/media/136313/download This test is not yet approved or cleared by the [place] FDA and has been authorized for detection and/or diagnosis of SARS-CoV-2 by FDA under an Emergency Use Authorization (EUA).  This EUA will remain in effect (meaning this test can be used) for the duration of the COVID-19 declaration under Section 564(b)(1) of the Act, 21 U.S.C. section 360bbb-3(b)(1), unless the authorization is terminated or revoked sooner. Performed at [facility], [address] [place], [place] [address]   SARS Coronavirus 2 (CEPHEID - Performed in [facility] lab), Hosp Order     Status: None   Collection Time: 03/29/19 11:11 AM  Result Value Ref Range Status   SARS Coronavirus 2 NEGATIVE NEGATIVE Final    Comment: (NOTE) If result is NEGATIVE  SARS-CoV-2 target nucleic acids are NOT DETECTED. The SARS-CoV-2 RNA is generally detectable in upper and lower  respiratory specimens during the acute phase of infection. The lowest  concentration of SARS-CoV-2 viral copies this assay can detect is 250  copies / mL. A negative result does not preclude SARS-CoV-2 infection  and should not be used as the sole basis for treatment or other  patient management decisions.  A negative result may occur with  improper specimen collection / handling, submission of specimen other  than nasopharyngeal swab, presence of viral mutation(s) within the  areas targeted by this assay,  and inadequate number of viral copies  (<250 copies / mL). A negative result must be combined with clinical  observations, patient history, and epidemiological information. If result is POSITIVE SARS-CoV-2 target nucleic acids are DETECTED. The SARS-CoV-2 RNA is generally detectable in upper and lower  respiratory specimens dur ing the acute phase of infection.  Positive  results are indicative of active infection with SARS-CoV-2.  Clinical  correlation with patient history and other diagnostic information is  necessary to determine patient infection status.  Positive results do  not rule out bacterial infection or co-infection with other viruses. If result is PRESUMPTIVE POSTIVE SARS-CoV-2 nucleic acids MAY BE PRESENT.   A presumptive positive result was obtained on the submitted specimen  and confirmed on repeat testing.  While 2019 novel coronavirus  (SARS-CoV-2) nucleic acids may be present in the submitted sample  additional confirmatory testing may be necessary for epidemiological  and / or clinical management purposes  to differentiate between  SARS-CoV-2 and other Sarbecovirus currently known to infect humans.  If clinically indicated additional testing with an alternate test  methodology [phone]) is advised. The SARS-CoV-2 RNA is generally  detectable in upper and lower respiratory sp ecimens during the acute  phase of infection. The expected result is Negative. Fact Sheet for Patients:  BoilerBrush.com.cy Fact Sheet for Healthcare Providers: https://pope.com/ This test is not yet approved or cleared by the [place] FDA and has been authorized for detection and/or diagnosis of SARS-CoV-2 by FDA under an Emergency Use Authorization (EUA).  This EUA will remain in effect (meaning this test can be used) for the duration of the COVID-19 declaration under Section 564(b)(1) of the Act, 21 U.S.C. section 360bbb-3(b)(1), unless  the authorization is terminated or revoked sooner. Performed at [facility], [address] [place], [place] [address]     Please note: You were cared for by a hospitalist during your hospital stay. Once you are discharged, your primary care physician will handle any further medical issues. Please note that NO REFILLS for any discharge medications will be authorized once you are discharged, as it is imperative that you return to your primary care physician (or establish a relationship with a primary care physician if you do not have one) for your post hospital discharge needs so that they can reassess your need for medications and monitor your lab values.    Time coordinating discharge: 40 minutes  SIGNED:   Burnadette Pop, MD  [facility] 03/30/2019, 2:13 PM Pager [phone]  If 7PM-7AM, please contact night-coverage [email or web address] Password [email or web address]

## 2019-03-30 NOTE — Telephone Encounter (Signed)
-----   Message from Alfredia Ferguson, PA-C sent at 03/30/2019 12:03 PM EDT ----- Regarding: office visit, labs Pt being discharged later today  Had gallstone pancreatitis - Necrosis on initial  CT   Please arrange Hepatic panel / CBC c diff in one week, office/virtual visit  with Dr Rush Landmark

## 2019-03-30 NOTE — Progress Notes (Signed)
1 Day Post-Op  Subjective: Alert and stable.  States that he feels good and is looking forward to regular food.  Ambulating in halls.  Voiding without difficulty.  Minimal pain.  Successful cholecystectomy yesterday.  The cholangiogram showed patent biliary tree  CBC and complete metabolic panel pending  Objective: Vital signs in last 24 hours: Temp:  [97.9 F (36.6 C)-100.7 F (38.2 C)] 98.7 F (37.1 C) (06/10 0526) Pulse Rate:  [57-102] 66 (06/10 0526) Resp:  [10-25] 18 (06/10 0526) BP: (95-132)/(61-76) 95/67 (06/10 0526) SpO2:  [89 %-98 %] 96 % (06/10 0526) Weight:  [115.7 kg] 115.7 kg (06/09 1156) Last BM Date: 03/25/19  Intake/Output from previous day: 06/09 0701 - 06/10 0700 In: 3481.4 [P.O.:1160; I.V.:2021.4; IV Piggyback:300] Out: 2905 [Urine:2875; Blood:30] Intake/Output this shift: Total I/O In: 1261.4 [P.O.:1160; I.V.:1.4; IV Piggyback:100] Out: 2100 [Urine:2100]  General appearance: Alert.  Mental status normal.  No distress. Resp: clear to auscultation bilaterally GI: Soft.  Nondistended.  Wounds look fine.  Benign postop exam Extremities: extremities normal, atraumatic, no cyanosis or edema  Lab Results:  Results for orders placed or performed during the hospital encounter of 03/25/19 (from the past 24 hour(s))  SARS Coronavirus 2 (CEPHEID - Performed in [place] hospital lab), Hosp Order     Status: None   Collection Time: 03/29/19 11:11 AM  Result Value Ref Range   SARS Coronavirus 2 NEGATIVE NEGATIVE     Studies/Results: Dg Cholangiogram Operative  Result Date: 03/29/2019 CLINICAL DATA:  Cholelithiasis EXAM: INTRAOPERATIVE CHOLANGIOGRAM TECHNIQUE: Cholangiographic images from the C-arm fluoroscopic device were submitted for interpretation post-operatively. Please see the procedural report for the amount of contrast and the fluoroscopy time utilized. COMPARISON:  03/26/2019 FINDINGS: Intraoperative cholangiogram performed during laparoscopic procedure.  The residual cystic duct, biliary confluence, common hepatic, and common bile duct are patent. No dilatation, obstruction, stricture, or large filling defect. Contrast drains easily into the duodenum. IMPRESSION: Patent biliary system. Electronically Signed   By: Jerilynn Mages.  Shick M.D.   On: 03/29/2019 16:18    . feeding supplement  1 Container Oral BID BM  . feeding supplement (PRO-STAT SUGAR FREE 64)  30 mL Oral BID  . [START ON 03/31/2019] multivitamin with minerals  1 tablet Oral Daily  . pantoprazole (PROTONIX) IV  40 mg Intravenous Q24H  . polyethylene glycol  17 g Oral Daily     Assessment/Plan: s/p Procedure(s): LAPAROSCOPIC CHOLECYSTECTOMY WITH INTRAOPERATIVE CHOLANGIOGRAM  POD #1.  Laparoscopic cholecystectomy with cholangiogram -Cholangiogram shows patent biliary tree -Advance to soft diet-on meropenem antibiotic -check labs  -Given MRCP findings of pancreatic edema and possible pancreatic necrosis, it is unclear how long he will need antibiotics or hospitalization.  I would favor decision making based on clinical progress rather than imaging findings.  Hopefully lab work will be trending back to normal this morning -Clinically he looks much better than the MR CP.  @PROBHOSP @  LOS: 3 days    Adin Hector 03/30/2019  . .prob

## 2019-03-30 NOTE — Telephone Encounter (Signed)
Hepatic panel & CBC in Epic for 1 week out. Doximity visit with Dr. Rush Landmark 04/15/19 at 10:00am (this was first available). Patient getting discharged from [facility] today. Staff message to call him with this information, tomorrow.

## 2019-03-30 NOTE — Anesthesia Postprocedure Evaluation (Signed)
Anesthesia Post Note  Patient: Benjamin Gregory  Procedure(s) Performed: LAPAROSCOPIC CHOLECYSTECTOMY WITH INTRAOPERATIVE CHOLANGIOGRAM (N/A )     Patient location during evaluation: PACU Anesthesia Type: General Level of consciousness: sedated and patient cooperative Pain management: pain level controlled Vital Signs Assessment: post-procedure vital signs reviewed and stable Respiratory status: spontaneous breathing Cardiovascular status: stable Anesthetic complications: no    Last Vitals:  Vitals:   03/30/19 0223 03/30/19 0526  BP: 109/61 95/67  Pulse: (!) 57 66  Resp: 18 18  Temp: 36.6 C 37.1 C  SpO2: 94% 96%    Last Pain:  Vitals:   03/30/19 0526  TempSrc: Oral  PainSc:                  Nolon Nations

## 2019-03-30 NOTE — Progress Notes (Addendum)
Patient ID: Benjamin Gregory, male   DOB: Mar 18, 1986, 33 y.o.   MRN: [id number]    Progress Note   Subjective   Day # 4 S/P Lap chole and IOC  Yesterday - IOC neg for CBD Hilger  He is feeling much better , sore after surgery but says pancreatitis pain is about gone - has not taken any pain meds today - hoping for discharge home, plans to stay with parents for a few days  Tolerated regular diet this am On Meropenum  WBC today - WBC down to 15.5 Tbili 1.9/alk phosp 91/ALT132/AST52- all improving   Objective   Vital signs in last 24 hours: Temp:  [97.9 F (36.6 C)-100.7 F (38.2 C)] 98.7 F (37.1 C) (06/10 0526) Pulse Rate:  [57-102] 66 (06/10 0526) Resp:  [10-25] 18 (06/10 0526) BP: (95-132)/(61-76) 95/67 (06/10 0526) SpO2:  [89 %-98 %] 96 % (06/10 0526) Weight:  [115.7 kg] 115.7 kg (06/09 1156) Last BM Date: 03/25/19 General:    Annamaria Boots WM in NAD Heart:  Regular rate and rhythm; no murmurs Lungs: Respirations even and unlabored, lungs CTA bilaterally Abdomen:  Soft, minimally tender at surg ports, nondistended. Normal bowel sounds. Extremities:  Without edema. Neurologic:  Alert and oriented,  grossly normal neurologically. Psych:  Cooperative. Normal mood and affect.  Intake/Output from previous day: 06/09 0701 - 06/10 0700 In: 3601.4 [P.O.:1280; I.V.:2021.4; IV Piggyback:300] Out: 2905 [Urine:2875; Blood:30] Intake/Output this shift: No intake/output data recorded.  Lab Results: Recent Labs    03/28/19 0559 03/29/19 0532  WBC 17.1* 17.2*  HGB 15.1 14.3  HCT 48.6 44.6  PLT 169 238   BMET Recent Labs    03/28/19 0559 03/29/19 0532  NA 135 133*  K 3.8 3.7  CL 101 94*  CO2 26 30  GLUCOSE 122* 114*  BUN 12 9  CREATININE 0.75 0.79  CALCIUM 8.1* 8.2*   LFT Recent Labs    03/28/19 1906 03/29/19 0532  PROT  --  6.7  ALBUMIN  --  3.0*  AST  --  43*  ALT  --  162*  ALKPHOS  --  101  BILITOT  --  2.6*  BILIDIR 1.2*  --    PT/INR No results for input(s):  LABPROT, INR in the last 72 hours.  Studies/Results: Dg Cholangiogram Operative  Result Date: 03/29/2019 CLINICAL DATA:  Cholelithiasis EXAM: INTRAOPERATIVE CHOLANGIOGRAM TECHNIQUE: Cholangiographic images from the C-arm fluoroscopic device were submitted for interpretation post-operatively. Please see the procedural report for the amount of contrast and the fluoroscopy time utilized. COMPARISON:  03/26/2019 FINDINGS: Intraoperative cholangiogram performed during laparoscopic procedure. The residual cystic duct, biliary confluence, common hepatic, and common bile duct are patent. No dilatation, obstruction, stricture, or large filling defect. Contrast drains easily into the duodenum. IMPRESSION: Patent biliary system. Electronically Signed   By: Jerilynn Mages.  Shick M.D.   On: 03/29/2019 16:18       Assessment / Plan:     #11  33 year old white male admitted on 03/26/2019 with acute gallstone pancreatitis.  Initial imaging with MRI abdomen/MRCP showed multiple gallstones, no choledocholithiasis. 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 In addition noted duodenal wall thickening.  Patient has improved clinically quickly, and is now stable day 1 post laparoscopic cholecystectomy and IOC which was negative.  He is not having any significant abdominal pain, not requiring regular pain medications and is tolerating a regular diet.  Leukocytosis is improved, and LFTs continue to trend down, he has been afebrile.  Patient had been empirically placed on  Meropenum due to finding of area of pancreatic necrosis   Plan; Patient appears to be stable for discharge to home later today Discussed continuing a low-fat diet and avoiding alcohol over the next several weeks until we know that his pancreatitis completely resolves.  Discussed antibiotics  with Dr. Rush Landmark ,will  complete 7 days total - so please send  home on Augmentin 875 mg po bid x 3 days  He will need outpatient follow-up with GI-Dr. Rush Landmark, and will need follow-up labs in one week , eventual repeat CT - We will arrange.        Principal Problem:   Choledocholithiasis Active Problems:   Gall [place] pancreatitis   Encounter for screening for HIV     LOS: 3 days   Sophia Sperry  03/30/2019, 9:13 AM

## 2019-03-31 ENCOUNTER — Telehealth: Payer: Self-pay

## 2019-03-31 NOTE — Telephone Encounter (Signed)
Left message to call back  

## 2019-04-04 ENCOUNTER — Other Ambulatory Visit: Payer: Self-pay | Admitting: Physician Assistant

## 2019-04-04 ENCOUNTER — Telehealth: Payer: Self-pay | Admitting: Gastroenterology

## 2019-04-04 ENCOUNTER — Telehealth: Payer: Self-pay

## 2019-04-04 NOTE — Progress Notes (Signed)
Dear Benjamin Gregory:   This is to let you know that Dr. Rush Landmark  has cleared Benjamin Gregory  to go back to work. If there are any questions about this, you may reach [place] at [phone].   Sincerely,    G.I.

## 2019-04-04 NOTE — Telephone Encounter (Signed)
Patient called would like to know if he can get a back to work note. He was at the ED from 6/05 - 6/10. Would like a call back.

## 2019-04-04 NOTE — Telephone Encounter (Signed)
Have not been able to reach patient with multiple attempts by phone, to notify of up coming Labs and a Virtual visit. Sent information by My Chart and sent a letter.

## 2019-04-06 ENCOUNTER — Other Ambulatory Visit (INDEPENDENT_AMBULATORY_CARE_PROVIDER_SITE_OTHER): Payer: Commercial Managed Care - PPO

## 2019-04-06 DIAGNOSIS — K851 Biliary acute pancreatitis without necrosis or infection: Secondary | ICD-10-CM

## 2019-04-06 LAB — HEPATIC FUNCTION PANEL
ALT: 89 U/L — ABNORMAL HIGH (ref 0–53)
AST: 61 U/L — ABNORMAL HIGH (ref 0–37)
Albumin: 3.5 g/dL (ref 3.5–5.2)
Alkaline Phosphatase: 121 U/L — ABNORMAL HIGH (ref 39–117)
Bilirubin, Direct: 0.4 mg/dL — ABNORMAL HIGH (ref 0.0–0.3)
Total Bilirubin: 0.8 mg/dL (ref 0.2–1.2)
Total Protein: 7.6 g/dL (ref 6.0–8.3)

## 2019-04-06 LAB — CBC WITH DIFFERENTIAL/PLATELET
Basophils Absolute: 0.1 10*3/uL (ref 0.0–0.1)
Basophils Relative: 0.6 % (ref 0.0–3.0)
Eosinophils Absolute: 0.3 10*3/uL (ref 0.0–0.7)
Eosinophils Relative: 2.1 % (ref 0.0–5.0)
HCT: 41.7 % (ref 39.0–52.0)
Hemoglobin: 13.9 g/dL (ref 13.0–17.0)
Lymphocytes Relative: 15.5 % (ref 12.0–46.0)
Lymphs Abs: 2.2 10*3/uL (ref 0.7–4.0)
MCHC: 33.4 g/dL (ref 30.0–36.0)
MCV: 90.4 fl (ref 78.0–100.0)
Monocytes Absolute: 0.6 10*3/uL (ref 0.1–1.0)
Monocytes Relative: 4.1 % (ref 3.0–12.0)
Neutro Abs: 10.9 10*3/uL — ABNORMAL HIGH (ref 1.4–7.7)
Neutrophils Relative %: 77.7 % — ABNORMAL HIGH (ref 43.0–77.0)
Platelets: 479 10*3/uL — ABNORMAL HIGH (ref 150.0–400.0)
RBC: 4.61 Mil/uL (ref 4.22–5.81)
RDW: 12.8 % (ref 11.5–15.5)
WBC: 14.1 10*3/uL — ABNORMAL HIGH (ref 4.0–10.5)

## 2019-04-15 ENCOUNTER — Ambulatory Visit (INDEPENDENT_AMBULATORY_CARE_PROVIDER_SITE_OTHER): Payer: Commercial Managed Care - PPO | Admitting: Gastroenterology

## 2019-04-15 DIAGNOSIS — K8689 Other specified diseases of pancreas: Secondary | ICD-10-CM

## 2019-04-15 DIAGNOSIS — R935 Abnormal findings on diagnostic imaging of other abdominal regions, including retroperitoneum: Secondary | ICD-10-CM | POA: Diagnosis not present

## 2019-04-15 DIAGNOSIS — R1084 Generalized abdominal pain: Secondary | ICD-10-CM

## 2019-04-15 DIAGNOSIS — D72829 Elevated white blood cell count, unspecified: Secondary | ICD-10-CM

## 2019-04-15 DIAGNOSIS — K851 Biliary acute pancreatitis without necrosis or infection: Secondary | ICD-10-CM | POA: Diagnosis not present

## 2019-04-15 DIAGNOSIS — R61 Generalized hyperhidrosis: Secondary | ICD-10-CM

## 2019-04-15 MED ORDER — OMEPRAZOLE 40 MG PO CPDR: 40.0000 mg | DELAYED_RELEASE_CAPSULE | Freq: Two times a day (BID) | ORAL | 3 refills | Status: DC

## 2019-04-15 NOTE — Patient Instructions (Addendum)
Your provider has requested that you go to the basement level for lab work before leaving today. Press "B" on the elevator. The lab is located at the first door on the left as you exit the elevator. 1 week before your follow-up appointment in 4 weeks.   We have sent the following medications to your pharmacy for you to pick up at your convenience: Omeprazole   Our office will contact you regarding your follow-up appointment in 4 weeks.   Thank you for choosing me and [facility].  Dr. Rush Landmark

## 2019-04-15 NOTE — Progress Notes (Signed)
[place] VISIT   Primary Care Provider Etter Sjogren [place] [id number] [phone]   Patient Profile: Lorimer Tiberio is a 33 y.o. male with a pmh significant for pancreatitis (possible necrosis) secondary to presumed biliary pathology, status post cholecystectomy.   The patient presents to the [facility] for an evaluation and management of problem(s) noted below:  Problem List 1. History of Gallstone Pancreatitis   2. Pancreatic necrosis   3. Abnormal MRI of abdomen   4. Night sweats   5. Generalized postprandial abdominal pain   6. Leukocytosis, unspecified type     I connected with  Macarthur Critchley on 04/15/19. I verified that I was speaking with the correct person using two identifiers. Due to the COVID-19 Pandemic, this service was provided via telemedicine using audio/visual media. The patient was located at home. The provider was located in the office. The patient did consent to this visit and is aware of charges through their insurance as well as the limitations of evaluation and management by telemedicine. Other persons participating in this telemedicine service were none. Time spent on visit was greater than 25 minutes in coordination of care and review of imaging and in discussion with patient.   History of Present Illness This is a patient that I met in the hospital in the setting of hospitalization for gallstone pancreatitis.  Imaging suggestive of possible small necrosis.  MRI unremarkable for choledocholithiasis and underwent cholecystectomy with IOC.  Liver tests were downtrending at time of discharge.  Follow-up laboratories done few weeks ago that showed continued improvement in the majority of liver biochemical tests however alkaline phosphatase had slightly elevated as noted below.  Bilirubin had normalized.  Overall, the patient states that he followed up with surgery recently  and discussed with them some issues that he was having.  He does describe some abdominal upset and queasiness that has been occurring really since his discharge.  He also has described issues of waking up in the middle of the night with night sweats.  He has had to change his sheets or his pajamas as result of this.  When he discussed this with surgery he was told this was a temperature dysregulation status post surgery.  He has never had a fever however even after checking.  The patient denies any nausea or vomiting.  Discomfort is located in the midepigastric region.  Patient denies any changes in his bowel habits.  No melena or hematochezia.  Weight loss occurred during the hospitalization and stabilized.  He is eating a normal diet currently.  No prior history of endoscopy or colonoscopy.  He denies any jaundice or scleral icterus.  GI Review of Systems Positive as above Negative for pyrosis, early satiety, anorexia  Review of Systems General: Positive for night sweats as described above; denies fevers/chills HEENT: Denies oral lesions Cardiovascular: Denies chest pain Pulmonary: Denies shortness of breath/nocturnal cough Gastroenterological: See HPI Genitourinary: Denies darkened urine Hematological: Denies easy bruising/bleeding Dermatological: Denies skin changes Psychological: Mood is stable   Medications Current Outpatient Medications  Medication Sig Dispense Refill   acetaminophen (TYLENOL) 325 MG tablet You can take 3 tablets every 8 hours for pain.  This is your primary pain relief medication.  You can alternate this with plain ibuprofen. Do not take more than 4000 mg of Tylenol/acetaminophen per day it can harm your liver.     ibuprofen (ADVIL) 200 MG tablet You may take 2 to 3 tablets every  6 hours as needed for pain.  You can buy this over-the-counter at any drugstore.  This is your second pain relief medication.  You can alternate this with plain Tylenol/acetaminophen.  You  can buy this over-the-counter at any drugstore.     omeprazole (PRILOSEC) 40 MG capsule Take 1 capsule (40 mg total) by mouth 2 (two) times a day. 60 capsule 3   OVER THE COUNTER MEDICATION Take 1 tablet by mouth once.     oxyCODONE (OXY IR/ROXICODONE) 5 MG immediate release tablet Take 1-2 tablets (5-10 mg total) by mouth every 6 (six) hours as needed for breakthrough pain (Pain not relieved by ibuprofen/Tylenol). 15 tablet 0   No current facility-administered medications for this visit.     Allergies No Known Allergies  Histories History reviewed. No pertinent past medical history. Past Surgical History:  Procedure Laterality Date   CHOLECYSTECTOMY N/A 03/29/2019   Procedure: LAPAROSCOPIC CHOLECYSTECTOMY WITH INTRAOPERATIVE CHOLANGIOGRAM;  Surgeon: Ralene Ok, MD;  Location: [facility];  Service: General;  Laterality: N/A;   Social History   Socioeconomic History   Marital status: Single    Spouse name: Not on file   Number of children: Not on file   Years of education: Not on file   Highest education level: Not on file  Occupational History   Not on file  Social Needs   Financial resource strain: Not on file   Food insecurity    Worry: Not on file    Inability: Not on file   Transportation needs    Medical: Not on file    Non-medical: Not on file  Tobacco Use   Smoking status: Never Smoker   Smokeless tobacco: Never Used  Substance and Sexual Activity   Alcohol use: Yes    Alcohol/week: 4.0 standard drinks    Types: 4 Standard drinks or equivalent per week   Drug use: Never   Sexual activity: Not on file  Lifestyle   Physical activity    Days per week: Not on file    Minutes per session: Not on file   Stress: Not on file  Relationships   Social connections    Talks on phone: Not on file    Gets together: Not on file    Attends religious service: Not on file    Active member of club or organization: Not on file    Attends meetings of clubs  or organizations: Not on file    Relationship status: Not on file   Intimate partner violence    Fear of current or ex partner: Not on file    Emotionally abused: Not on file    Physically abused: Not on file    Forced sexual activity: Not on file  Other Topics Concern   Not on file  Social History Narrative   Not on file   Family History  Problem Relation Age of Onset   Hypertension Father    Colon cancer Neg Hx    Esophageal cancer Neg Hx    Inflammatory bowel disease Neg Hx    Liver disease Neg Hx    Pancreatic cancer Neg Hx    Rectal cancer Neg Hx    Stomach cancer Neg Hx    I have reviewed his medical, social, and family history in detail and updated the electronic medical record as necessary.    PHYSICAL EXAMINATION  Telehealth Visit  REVIEW OF DATA  I reviewed the following data at the time of this encounter:  GI Procedures and  Studies  No relevant studies to review  Laboratory Studies  Reviewed in epic  03/30/2019 09:29 04/06/2019 07:34  AST 52 (H) 61 (H)  ALT 132 (H) 89 (H)  Bilirubin, Direct  0.4 (H)  Total Bilirubin 1.9 (H) 0.8  Alkaline Phosphatase 91 121 (H)    Imaging Studies  June 2020 IOC IMPRESSION: Patent biliary system.  June 2020 MRICP IMPRESSION: 1. Cholelithiasis, but no biliary duct dilatation or evidence of choledocholithiasis. 2. 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 3. Duodenal wall thickening, likely related to secondary duodenitis.  June 2020 Abdominal U/S IMPRESSION: 1. There is cholelithiasis without secondary signs of acute cholecystitis. 2. Dilated common bile duct suspicious for underlying choledocholithiasis. Follow-up with MRCP/ERCP is recommended. 3. Diffuse increased echogenicity with slightly heterogeneous liver. Appearance typically secondary to fatty infiltration. Fibrosis secondary consideration. No secondary findings of cirrhosis noted. No focal hepatic lesion or  intrahepatic biliary duct dilatation.   ASSESSMENT  Mr. Slone is a 33 y.o. male with a pmh significant for pancreatitis (possible necrosis) secondary to presumed biliary pathology, status post cholecystectomy.  The patient is seen today for evaluation and management of:  1. History of Gallstone Pancreatitis   2. Pancreatic necrosis   3. Abnormal MRI of abdomen   4. Night sweats   5. Generalized postprandial abdominal pain   6. Leukocytosis, unspecified type    The patient seems to be hemodynamically stable and overall from a clinical perspective improved from his episode of pancreatitis.  However, the patient is experiencing some abdominal discomfort and is having night sweats as well as a persistent leukocytosis on last set of labs although this was over a week ago.  The etiology of his leukocytosis remains unclear.  One would query the possibility of the findings on MRI imaging of necrosis whether there could be some role to that.  The patient was noted to have duodenitis as well and is not been on PPI therapy at time of his discharge.  I wonder if his postprandial abdominal discomfort is a result of some gastritis duodenitis as a result of his recent pancreatitis.  Although we need to consider the possibility of underlying pancreatic necrosis or development of peripancreatic fluid collections as a potential source for his symptoms as well should they not abate.  I would like to initiate the patient on high-dose PPI therapy and see how he is doing over the course the next few weeks.  We need to repeat his liver biochemical tests and I would like to repeat those in approximately 3 weeks which will be about a month since his last set of labs.  If his liver biochemical tests have continued to improve and are normalized then we will not need to require further endoscopic evaluation such as with an EUS and EGD.  However if his symptoms are not improving or he has any persistent worsening of symptoms I  think we should obtain cross-sectional imaging to reevaluate the pancreas and ensure that we are not seeing any development of peripancreatic fluid collections or walled off necrosis.  In regards to his leukocytosis I think this needs or requires further follow-up with his primary care provider and I have updated that person on this note and this note will be CCed to them as well.  Even if the patient is doing better at time of follow-up I think repeat cross-sectional imaging in approximately 2 to 3 months to evaluate and ensure that the pancreas is completely improved will be important.  All  patient questions were answered, to the best of my ability, and the patient agrees to the aforementioned plan of action with follow-up as indicated.   PLAN  Initiate omeprazole 40 mg twice daily Repeat laboratories in approximately 3 weeks as outlined below Discussed leukocytosis with PCP (patient will do this) If patient has progressive symptoms or persistent symptoms at time of follow-up or sooner then may require repeat cross-sectional imaging with a CT abdomen with IV/oral contrast to evaluate for evidence of pancreatic necrosis sooner rather than later Tentatively would plan a repeat cross-sectional image in approximately 2 to 3 months If liver tests have not improved at time of follow-up we will consider the role of an EGD/EUS to ensure patient does not have choledocholithiasis   Orders Placed This Encounter  Procedures   CBC   Comp Met (CMET)   Amylase   Lipase    New Prescriptions   OMEPRAZOLE (PRILOSEC) 40 MG CAPSULE    Take 1 capsule (40 mg total) by mouth 2 (two) times a day.   Modified Medications   No medications on file    Planned Follow Up No follow-ups on file.   Justice Britain, MD [facility] Office # [phone]

## 2019-04-16 ENCOUNTER — Encounter: Payer: Self-pay | Admitting: Gastroenterology

## 2019-04-16 DIAGNOSIS — R935 Abnormal findings on diagnostic imaging of other abdominal regions, including retroperitoneum: Secondary | ICD-10-CM | POA: Insufficient documentation

## 2019-04-16 DIAGNOSIS — R1084 Generalized abdominal pain: Secondary | ICD-10-CM | POA: Insufficient documentation

## 2019-04-16 DIAGNOSIS — K8689 Other specified diseases of pancreas: Secondary | ICD-10-CM | POA: Insufficient documentation

## 2019-04-16 DIAGNOSIS — D72829 Elevated white blood cell count, unspecified: Secondary | ICD-10-CM | POA: Insufficient documentation

## 2019-04-16 DIAGNOSIS — R61 Generalized hyperhidrosis: Secondary | ICD-10-CM | POA: Insufficient documentation

## 2019-04-18 ENCOUNTER — Telehealth: Payer: Self-pay

## 2019-04-18 NOTE — Telephone Encounter (Signed)
The pt will call in 2-3 weeks to update on his condition.Benjamin Gregory

## 2019-04-18 NOTE — Telephone Encounter (Signed)
-----   Message from Irving Copas., MD sent at 04/16/2019  6:56 AM EDT ----- Regarding: Follow-up Benjamin Gregory,Can you please reach out to the patient next week and let them know that after a couple weeks of being on the PPI if he is still having issues he should reach out to [place] even if it is before his laboratories are completed.We may need to consider a CT scan to evaluate his abdomen and make sure that the pancreas is not looking any worse than prior and not a reason why he continues to have some discomfort.But reach out to him in approximately 2-1/2 to 3 weeks.Thanks.GM

## 2019-06-03 ENCOUNTER — Other Ambulatory Visit: Payer: Self-pay | Admitting: Physician Assistant

## 2019-06-03 DIAGNOSIS — R1013 Epigastric pain: Secondary | ICD-10-CM

## 2019-06-16 ENCOUNTER — Other Ambulatory Visit: Payer: Commercial Managed Care - PPO

## 2019-06-21 ENCOUNTER — Encounter: Payer: Self-pay | Admitting: Gastroenterology

## 2019-06-21 ENCOUNTER — Other Ambulatory Visit (INDEPENDENT_AMBULATORY_CARE_PROVIDER_SITE_OTHER): Payer: Commercial Managed Care - PPO

## 2019-06-21 ENCOUNTER — Ambulatory Visit (INDEPENDENT_AMBULATORY_CARE_PROVIDER_SITE_OTHER): Payer: Commercial Managed Care - PPO | Admitting: Gastroenterology

## 2019-06-21 VITALS — BP 110/70 | HR 74 | Temp 98.4°F | Ht 71.0 in | Wt 247.0 lb

## 2019-06-21 DIAGNOSIS — K8689 Other specified diseases of pancreas: Secondary | ICD-10-CM

## 2019-06-21 DIAGNOSIS — R945 Abnormal results of liver function studies: Secondary | ICD-10-CM | POA: Diagnosis not present

## 2019-06-21 DIAGNOSIS — R1084 Generalized abdominal pain: Secondary | ICD-10-CM

## 2019-06-21 DIAGNOSIS — R7989 Other specified abnormal findings of blood chemistry: Secondary | ICD-10-CM

## 2019-06-21 DIAGNOSIS — Z8719 Personal history of other diseases of the digestive system: Secondary | ICD-10-CM | POA: Diagnosis not present

## 2019-06-21 LAB — HEPATIC FUNCTION PANEL
ALT: 12 U/L (ref 0–53)
AST: 15 U/L (ref 0–37)
Albumin: 4.4 g/dL (ref 3.5–5.2)
Alkaline Phosphatase: 64 U/L (ref 39–117)
Bilirubin, Direct: 0.1 mg/dL (ref 0.0–0.3)
Total Bilirubin: 0.5 mg/dL (ref 0.2–1.2)
Total Protein: 7.4 g/dL (ref 6.0–8.3)

## 2019-06-21 NOTE — Patient Instructions (Signed)
Your provider has requested that you go to the basement level for lab work before leaving today. Press "B" on the elevator. The lab is located at the first door on the left as you exit the elevator.  You have been scheduled for a CT scan of the abdomen and pelvis at [facility] are scheduled on  at 7:30 You should arrive 15 minutes prior to your appointment time for registration. Please follow the written instructions below on the day of your exam:  WARNING: IF YOU ARE ALLERGIC TO IODINE/X-RAY DYE, PLEASE NOTIFY RADIOLOGY IMMEDIATELY AT [phone]! YOU WILL BE GIVEN A 13 HOUR PREMEDICATION PREP.  1) Do not eat or drink anything after 3:30am (4 hours prior to your test) 2) You have been given 2 bottles of oral contrast to drink. The solution may taste better if refrigerated, but do NOT add ice or any other liquid to this solution. Shake well before drinking.    Drink 1 bottle of contrast @ 5:30am (2 hours prior to your exam)  Drink 1 bottle of contrast @ 6:30am (1 hour prior to your exam)  You may take any medications as prescribed with a small amount of water, if necessary. If you take any of the following medications: METFORMIN, GLUCOPHAGE, GLUCOVANCE, AVANDAMET, RIOMET, FORTAMET, Pavo MET, JANUMET, GLUMETZA or METAGLIP, you MAY be asked to HOLD this medication 48 hours AFTER the exam.  The purpose of you drinking the oral contrast is to aid in the visualization of your intestinal tract. The contrast solution may cause some diarrhea. Depending on your individual set of symptoms, you may also receive an intravenous injection of x-ray contrast/dye. Plan on being at [facility] for 30 minutes or longer, depending on the type of exam you are having performed.  This test typically takes 30-45 minutes to complete.  If you have any questions regarding your exam or if you need to reschedule, you may call the CT department at [phone] between the hours of  8:00 am and 5:00 pm, Monday-Friday.  ________________________________________________________________________  Thank you for choosing me and [facility].  Dr. Rush Landmark

## 2019-06-22 ENCOUNTER — Encounter: Payer: Self-pay | Admitting: Gastroenterology

## 2019-06-22 DIAGNOSIS — Z8719 Personal history of other diseases of the digestive system: Secondary | ICD-10-CM | POA: Insufficient documentation

## 2019-06-22 DIAGNOSIS — R7989 Other specified abnormal findings of blood chemistry: Secondary | ICD-10-CM | POA: Insufficient documentation

## 2019-06-22 DIAGNOSIS — R945 Abnormal results of liver function studies: Secondary | ICD-10-CM | POA: Insufficient documentation

## 2019-06-22 NOTE — Progress Notes (Signed)
GASTROENTEROLOGY OUTPATIENT CLINIC VISIT   Primary Care Provider Agnes Lawrence [address] [place] [address] [phone]   Patient Profile: Benjamin Gregory is a 33 y.o. male with a pmh significant for pancreatitis (possible necrosis) secondary to presumed biliary pathology, status post cholecystectomy.   The patient presents to the [facility] for an evaluation and management of problem(s) noted below:  Problem List 1. History of Pancreatic necrosis   2. Abnormal LFTs   3. Generalized postprandial abdominal pain   4. Hx of pancreatitis     History of Present Illness Please see initial consultation note and progress note for full details of HPI.    Interval History The patient has done well over the course of the last few weeks.  His postprandial abdominal discomfort has improved as well.  He did take PPI for short period of time and no longer taking it for the last 4 to 6 weeks.  No longer having any GERD symptoms.  Overall his weight is stabilized.  He is back to work as if nothing had happened previously.  If he does significant yoga stretching he may feel some discomfort in his mid abdomen which he believes is mostly stretching discomfort from his prior surgery.  The patient denies any abdominal pain, nausea, vomiting or symptoms currently.  No family history of liver disease.  He has not had his CT scan performed as of yet for follow-up of his pancreatic necrosis.  GI Review of Systems Positive as above Negative for dysphagia, odynophagia, bloating, early satiety, nausea, vomiting, change in bowel habits, melena, hematochezia   Review of Systems General: Denies fevers/chills/weight loss Cardiovascular: Denies chest pain Pulmonary: Denies shortness of breath Gastroenterological: See HPI Genitourinary: Denies darkened urine Hematological: Denies easy bruising/bleeding Dermatological: Denies jaundice Psychological: Mood is  stable   Medications No current outpatient medications on file.   No current facility-administered medications for this visit.     Allergies No Known Allergies  Histories Past Medical History:  Diagnosis Date  . Gallstones    Past Surgical History:  Procedure Laterality Date  . CHOLECYSTECTOMY N/A 03/29/2019   Procedure: LAPAROSCOPIC CHOLECYSTECTOMY WITH INTRAOPERATIVE CHOLANGIOGRAM;  Surgeon: Axel Filler, MD;  Location: [facility];  Service: General;  Laterality: N/A;   Social History   Socioeconomic History  . Marital status: Single    Spouse name: Not on file  . Number of children: 0  . Years of education: Not on file  . Highest education level: Not on file  Occupational History  . Occupation: Scientist, water quality  Social Needs  . Financial resource strain: Not on file  . Food insecurity    Worry: Not on file    Inability: Not on file  . Transportation needs    Medical: Not on file    Non-medical: Not on file  Tobacco Use  . Smoking status: Never Smoker  . Smokeless tobacco: Never Used  Substance and Sexual Activity  . Alcohol use: Yes    Alcohol/week: 4.0 standard drinks    Types: 4 Standard drinks or equivalent per week  . Drug use: Never  . Sexual activity: Not on file  Lifestyle  . Physical activity    Days per week: Not on file    Minutes per session: Not on file  . Stress: Not on file  Relationships  . Social Musician on phone: Not on file    Gets together: Not on file    Attends religious service:  Not on file    Active member of club or organization: Not on file    Attends meetings of clubs or organizations: Not on file    Relationship status: Not on file  . Intimate partner violence    Fear of current or ex partner: Not on file    Emotionally abused: Not on file    Physically abused: Not on file    Forced sexual activity: Not on file  Other Topics Concern  . Not on file  Social History Narrative  . Not on file   Family History   Problem Relation Age of Onset  . Hypertension Father   . Colon cancer Neg Hx   . Esophageal cancer Neg Hx   . Inflammatory bowel disease Neg Hx   . Liver disease Neg Hx   . Pancreatic cancer Neg Hx   . Rectal cancer Neg Hx   . Stomach cancer Neg Hx    I have reviewed his medical, social, and family history in detail and updated the electronic medical record as necessary.    PHYSICAL EXAMINATION  BP 110/70   Pulse 74   Temp 98.4 F (36.9 C)   Ht 5\' 11"  (1.803 m)   Wt 247 lb (112 kg)   BMI 34.45 kg/m  GEN: NAD, appears stated age, doesn't appear chronically ill PSYCH: Cooperative, without pressured speech EYE: Conjunctivae pink, sclerae anicteric ENT: MMM CV: RR without R/Gs  RESP: CTAB posteriorly, without wheezing GI: NABS, soft, NT/ND, without rebound or guarding, laparoscopic scar sites well-healed, no HSM appreciated MSK/EXT: No lower extremity edema SKIN: No jaundice NEURO:  Alert & Oriented x 3, no focal deficits   REVIEW OF DATA  I reviewed the following data at the time of this encounter:  GI Procedures and Studies  No relevant studies to review  Laboratory Studies  Reviewed those in epic  Imaging Studies  No new relevant studies to review   ASSESSMENT  Benjamin Gregory is a 33 y.o. male with a pmh significant for pancreatitis (possible necrosis) secondary to presumed biliary pathology, status post cholecystectomy.  The patient is seen today for evaluation and management of:  1. History of Pancreatic necrosis   2. Abnormal LFTs   3. Generalized postprandial abdominal pain   4. Hx of pancreatitis    Patient is doing well hemodynamically and clinically.  No significant episodes or recurrent postprandial abdominal discomfort.  Likely did well after PPI initiation for acid related changes.  He is now off of those.  This likely was duodenitis.  We will plan to hold on restarting PPI unless patient has recurrent symptoms at which point we may need to consider repeat  endoscopic evaluation.  Patient is abnormal liver tests may have been slowly trending downwards after his hospitalization procedure.  We will plan to repeat those.  If they remain abnormal we will pursue additional work-up.  Unlikely though discussed the possibility of a liver biopsy if liver tests were to remain elevated after 6 to 12 months.  We will see what his pancreas looks like at this point in September or October to see how the pancreatic necrosis has changed and hopefully we see an improved pancreas.  I think it will be unlikely that we will find a fluid collection or significant walled off necrosis based on how he is doing but we will see what his imaging shows.  All patient questions were answered, to the best of my ability, and the patient agrees to the aforementioned plan  of action with follow-up as indicated.   PLAN  Okay to hold on PPI use currently and may reinitiate if he has symptoms -If recurs may consider repeat endoscopic evaluation Follow-up liver biochemical testing today -If persistent elevation will continue work-up otherwise we will not need to be rechecked for 6 to 12 months CT abdomen/pelvis with IV/oral contrast to evaluate for previous pancreatic necrosis Follow-up to be dictated by findings of above   Orders Placed This Encounter  Procedures  . CT ABDOMEN PELVIS W CONTRAST  . Hepatic function panel    New Prescriptions   No medications on file   Modified Medications   No medications on file    Planned Follow Up No follow-ups on file.   Justice Britain, MD [facility] Advanced Endoscopy Office # [phone]

## 2019-06-24 ENCOUNTER — Encounter (HOSPITAL_COMMUNITY): Payer: Self-pay

## 2019-06-24 ENCOUNTER — Other Ambulatory Visit: Payer: Self-pay

## 2019-06-24 ENCOUNTER — Telehealth: Payer: Self-pay | Admitting: Gastroenterology

## 2019-06-24 ENCOUNTER — Ambulatory Visit (HOSPITAL_COMMUNITY)
Admission: RE | Admit: 2019-06-24 | Discharge: 2019-06-24 | Disposition: A | Discharge status: Home / Self Care | Payer: Commercial Managed Care - PPO | Source: Ambulatory Visit | Attending: Gastroenterology | Admitting: Gastroenterology

## 2019-06-24 DIAGNOSIS — Z8719 Personal history of other diseases of the digestive system: Secondary | ICD-10-CM

## 2019-06-24 DIAGNOSIS — K8689 Other specified diseases of pancreas: Secondary | ICD-10-CM | POA: Insufficient documentation

## 2019-06-24 MED ORDER — SODIUM CHLORIDE (PF) 0.9 % IJ SOLN
INTRAMUSCULAR | Status: AC
  Filled 2019-06-24: qty 50

## 2019-06-24 MED ORDER — IOHEXOL 300 MG/ML  SOLN
100.0000 mL | Freq: Once | INTRAMUSCULAR | Status: AC | PRN
  Administered 2019-06-24: 100 mL via INTRAVENOUS

## 2019-06-24 NOTE — Telephone Encounter (Signed)
Staff message to call pt and schedule in 4 weeks.

## 2019-06-24 NOTE — Telephone Encounter (Signed)
I called the patient this morning to discuss his CT scan results. He has 2 peripancreatic fluid collections. As per our recent clinic note he is doing extremely well without any evidence of early satiety or abdominal pain and has a stable weight. As such I would like to monitor these peripancreatic fluid collections. As large as they are I am now somewhat concerned about the possibility of a pancreatic duct leak that may be occurring slowly. I would normally let asymptomatic cyst go for 3 months before we reimage however because of how large these are I would like to image a little bit sooner.   I would like to plan a MRI/MRCP in 4 to 6 weeks to reevaluate the size of this as well as evaluate the pancreatic duct and see if any evidence of a pancreatic leak may be present from his recent pancreatitis and necrosis. Patient is aware of symptoms to be calling us in should they develop. Advanced RN Gerarda Fraction, please order MRI/MRCP in 4 to 6 weeks.   Justice Britain, MD [facility] Advanced Endoscopy Office # [phone]

## 2019-07-22 ENCOUNTER — Telehealth: Payer: Self-pay

## 2019-07-22 NOTE — Telephone Encounter (Signed)
-----   Message from Timothy Lasso, RN sent at 06/24/2019 10:08 AM EDT ----- I called the patient this morning to discuss his CT scan results. He has 2 peripancreatic fluid collections. As per our recent clinic note he is doing extremely well without any evidence of early satiety or abdominal pain and has a stable weight. As such I would like to monitor these peripancreatic fluid collections. As large as they are I am now somewhat concerned about the possibility of a pancreatic duct leak that may be occurring slowly. I would normally let asymptomatic cyst go for 3 months before we reimage however because of how large these are I would like to image a little bit sooner.   I would like to plan a MRI/MRCP in 4 to 6 weeks to reevaluate the size of this as well as evaluate the pancreatic duct and see if any evidence of a pancreatic leak may be present from his recent pancreatitis and necrosis. Patient is aware of symptoms to be calling us in should they develop. Advanced RN Gerarda Fraction, please order MRI/MRCP in 4 to 6 weeks.   Justice Britain, MD

## 2019-07-22 NOTE — Telephone Encounter (Signed)
Message sent to get the pt set up for MRI MRCP in 4-6 weeks.

## 2019-08-26 ENCOUNTER — Telehealth: Payer: Self-pay

## 2019-08-26 DIAGNOSIS — K8689 Other specified diseases of pancreas: Secondary | ICD-10-CM

## 2019-08-26 DIAGNOSIS — K851 Biliary acute pancreatitis without necrosis or infection: Secondary | ICD-10-CM

## 2019-08-26 DIAGNOSIS — R1084 Generalized abdominal pain: Secondary | ICD-10-CM

## 2019-08-26 NOTE — Telephone Encounter (Signed)
-----   Message from Timothy Lasso, RN sent at 07/22/2019  2:10 PM EDT ----- I called the patient this morning to discuss his CT scan results. He has 2 peripancreatic fluid collections. As per our recent clinic note he is doing extremely well without any evidence of early satiety or abdominal pain and has a stable weight. As such I would like to monitor these peripancreatic fluid collections. As large as they are I am now somewhat concerned about the possibility of a pancreatic duct leak that may be occurring slowly. I would normally let asymptomatic cyst go for 3 months before we reimage however because of how large these are I would like to image a little bit sooner.   I would like to plan a MRI/MRCP in 4 to 6 weeks to reevaluate the size of this as well as evaluate the pancreatic duct and see if any evidence of a pancreatic leak may be present from his recent pancreatitis and necrosis. Patient is aware of symptoms to be calling us in should they develop. Advanced RN Gerarda Fraction, please order MRI/MRCP in 4 to 6 weeks.   Justice Britain, MD

## 2019-08-26 NOTE — Telephone Encounter (Signed)
  Left message on machine to call back   You have been scheduled for an MRI at [facility] on 09/02/19. Your appointment time is 8 am. Please arrive 30  minutes prior to your appointment time for registration purposes. Please make certain not to have anything to eat or drink 6 hours prior to your test. In addition, if you have any metal in your body, have a pacemaker or defibrillator, please be sure to let your ordering physician know. This test typically takes 45 minutes to 1 hour to complete. Should you need to reschedule, please call [phone] to do so.

## 2019-08-30 NOTE — Telephone Encounter (Signed)
The patient has been notified of this information and all questions answered.

## 2019-09-02 ENCOUNTER — Other Ambulatory Visit: Payer: Self-pay

## 2019-09-02 ENCOUNTER — Ambulatory Visit (HOSPITAL_COMMUNITY)
Admission: RE | Admit: 2019-09-02 | Discharge: 2019-09-02 | Disposition: A | Discharge status: Home / Self Care | Payer: Commercial Managed Care - PPO | Source: Ambulatory Visit | Attending: Gastroenterology | Admitting: Gastroenterology

## 2019-09-02 ENCOUNTER — Other Ambulatory Visit: Payer: Self-pay | Admitting: Gastroenterology

## 2019-09-02 DIAGNOSIS — K851 Biliary acute pancreatitis without necrosis or infection: Secondary | ICD-10-CM | POA: Insufficient documentation

## 2019-09-02 DIAGNOSIS — R1084 Generalized abdominal pain: Secondary | ICD-10-CM | POA: Diagnosis present

## 2019-09-02 DIAGNOSIS — K8689 Other specified diseases of pancreas: Secondary | ICD-10-CM | POA: Insufficient documentation

## 2019-09-02 MED ORDER — GADOBUTROL 1 MMOL/ML IV SOLN
10.0000 mL | Freq: Once | INTRAVENOUS | Status: AC | PRN
  Administered 2019-09-02: 10 mL via INTRAVENOUS

## 2019-10-07 ENCOUNTER — Telehealth: Payer: Self-pay

## 2019-10-07 NOTE — Telephone Encounter (Signed)
-----   Message from Timothy Lasso, RN sent at 09/02/2019  1:23 PM EST ----- Chong Sicilian, please schedule a follow-up return to clinic in 6 to 8 weeks with me.  I will determine timing of repeat imaging at that time/clinic visit unless he has symptoms before our follow-up appointment.

## 2019-10-07 NOTE — Telephone Encounter (Signed)
appt has been made for 1/27 with Dr Rush Landmark pt notified via My Chart

## 2019-11-16 ENCOUNTER — Encounter: Payer: Self-pay | Admitting: Gastroenterology

## 2019-11-16 ENCOUNTER — Ambulatory Visit (INDEPENDENT_AMBULATORY_CARE_PROVIDER_SITE_OTHER): Payer: Commercial Managed Care - PPO | Admitting: Gastroenterology

## 2019-11-16 VITALS — BP 100/60 | HR 64 | Temp 98.3°F | Ht 70.25 in | Wt 259.4 lb

## 2019-11-16 DIAGNOSIS — Z8719 Personal history of other diseases of the digestive system: Secondary | ICD-10-CM

## 2019-11-16 DIAGNOSIS — K863 Pseudocyst of pancreas: Secondary | ICD-10-CM | POA: Diagnosis not present

## 2019-11-16 DIAGNOSIS — R935 Abnormal findings on diagnostic imaging of other abdominal regions, including retroperitoneum: Secondary | ICD-10-CM

## 2019-11-16 NOTE — Progress Notes (Signed)
GASTROENTEROLOGY OUTPATIENT CLINIC VISIT   Primary Care Provider Agnes Lawrence [address] [place] [address] [phone]   Patient Profile: Benjamin Gregory is a 34 y.o. 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  The patient presents to the [facility] for an evaluation and management of problem(s) noted below:  Problem List 1. Pancreatic pseudocyst   2. Abnormal MRI of abdomen   3. Hx of pancreatitis     History of Present Illness Please see initial consultation note and progress notes for full details of HPI.    Interval History The patient returns for scheduled follow up.  After his last visit we obtained an MRI abdomen to evaluate the concern for potential persistent fluid collection.  What was found was a relatively stable fluid collection with no evidence of ductal disruption or abnormality.  We talked with the patient over the course the last few months and he has not really had any significant symptoms.  Today, the patient reiterates that he is doing well.  He is eating well.  No significant nausea or vomiting.  Does not have any new changes in bowel habits.  Otherwise is feeling well and working.  GI Review of Systems Positive as above Negative for odynophagia, dysphagia, bloating, early satiety melena, hematochezia  Review of Systems General: Denies fevers/chills/weight loss Cardiovascular: Denies chest pain Pulmonary: Denies shortness of breath Gastroenterological: See HPI Genitourinary: Denies darkened urine Hematological: Denies easy bruising/bleeding Dermatological: Denies jaundice Psychological: Mood is stable   Medications No current outpatient medications on file.   No current facility-administered medications for this visit.    Allergies No Known  Allergies  Histories Past Medical History:  Diagnosis Date  . Gallstones   . Pseudocyst of pancreas    Past Surgical History:  Procedure Laterality Date  . CHOLECYSTECTOMY N/A 03/29/2019   Procedure: LAPAROSCOPIC CHOLECYSTECTOMY WITH INTRAOPERATIVE CHOLANGIOGRAM;  Surgeon: Axel Filler, MD;  Location: [facility];  Service: General;  Laterality: N/A;   Social History   Socioeconomic History  . Marital status: Single    Spouse name: Not on file  . Number of children: 0  . Years of education: Not on file  . Highest education level: Not on file  Occupational History  . Occupation: Scientist, water quality  Tobacco Use  . Smoking status: Never Smoker  . Smokeless tobacco: Never Used  Substance and Sexual Activity  . Alcohol use: Yes    Alcohol/week: 4.0 standard drinks    Types: 4 Standard drinks or equivalent per week  . Drug use: Never  . Sexual activity: Not on file  Other Topics Concern  . Not on file  Social History Narrative  . Not on file   Social Determinants of Health   Financial Resource Strain:   . Difficulty of Paying Living Expenses: Not on file  Food Insecurity:   . Worried About Programme researcher, broadcasting/film/video in the Last Year: Not on file  . Ran Out of Food in the Last Year: Not on file  Transportation Needs:   . Lack of Transportation (Medical): Not on file  . Lack of Transportation (Non-Medical): Not on file  Physical Activity:   . Days of Exercise per Week: Not on file  . Minutes of Exercise per Session: Not on file  Stress:   . Feeling of Stress : Not on file  Social Connections:   . Frequency of Communication with  Friends and Family: Not on file  . Frequency of Social Gatherings with Friends and Family: Not on file  . Attends Religious Services: Not on file  . Active Member of Clubs or Organizations: Not on file  . Attends Banker Meetings: Not on file  . Marital Status: Not on file  Intimate Partner Violence:   . Fear of Current or Ex-Partner: Not on  file  . Emotionally Abused: Not on file  . Physically Abused: Not on file  . Sexually Abused: Not on file   Family History  Problem Relation Age of Onset  . Hypertension Father   . Colon cancer Neg Hx   . Esophageal cancer Neg Hx   . Inflammatory bowel disease Neg Hx   . Liver disease Neg Hx   . Pancreatic cancer Neg Hx   . Rectal cancer Neg Hx   . Stomach cancer Neg Hx    I have reviewed his medical, social, and family history in detail and updated the electronic medical record as necessary.    PHYSICAL EXAMINATION  BP 100/60 (BP Location: Left Arm, Patient Position: Sitting, Cuff Size: Large)   Pulse 64   Temp 98.3 F (36.8 C)   Ht 5' 10.25" (1.784 m) Comment: height measured without shoes  Wt 259 lb 6 oz (117.7 kg)   BMI 36.95 kg/m  GEN: NAD, appears stated age, doesn't appear chronically ill PSYCH: Cooperative, without pressured speech EYE: Conjunctivae pink, sclerae anicteric  ENT: MMM CV: RR without R/Gs  RESP: CTAB posteriorly, without wheezing GI: NABS, soft, mildly protuberant, nontender, without rebound or guarding, laparoscopic scar sites well-healed, no HSM appreciated MSK/EXT: No lower extremity edema SKIN: No jaundice NEURO:  Alert & Oriented x 3, no focal deficits   REVIEW OF DATA  I reviewed the following data at the time of this encounter:  GI Procedures and Studies  No relevant studies to review  Laboratory Studies  Reviewed those in epic  Imaging Studies  November 2020 MRI abdomen IMPRESSION: 1. Large complex fluid collection within the pancreas most compatible with a pancreatic pseudocyst with some internal debris. This is associated with atrophy throughout the body and tail of the pancreas. Overall, this appears very similar to prior CT the abdomen and pelvis 06/24/2019. 2. Status post cholecystectomy.   ASSESSMENT  [name] is a 34 y.o. 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  The patient is seen today for evaluation and management of:  1. Pancreatic pseudocyst   2. Abnormal MRI of abdomen   3. Hx of pancreatitis    The patient is hemodynamically and clinically stable.  The patient shows no significant evidence of a large pseudocyst being symptomatic at this time.  As such, we will continue to monitor him and see how he does.  Should the patient develop symptoms that are concerning for symptoms of obstruction or pseudocyst infection, we will plan urgent repeat imaging otherwise we will plan this to be a 63-month interval from the last 1 which would be in May of this year.  Patient is hopeful that the cyst will continue to decrease in size.  We did discuss that if the cyst continues to enlarge even if he is doing well, we should have a continued conversation about the role of a endoscopic cyst gastrostomy as well as potential ERCP should there actually be a pancreas leak that is persisting.  However, since  he continues to do well I suspect things will be stable or better.  He agrees with this plan of action.  All patient questions were answered, to the best of my ability, and the patient agrees to the aforementioned plan of action with follow-up as indicated.   PLAN  CT abdomen/pelvis with IV and oral contrast in May 2021 If patient develops symptoms concerning for symptomatic pseudocyst, will need more urgent CT abdomen/pelvis Okay for PPI use if needed in future but he is doing well off of it Follow-up after CT abdomen pelvis   Orders Placed This Encounter  Procedures  . CT Abdomen Pelvis W Contrast    New Prescriptions   No medications on file   Modified Medications   No medications on file    Planned Follow Up Return in about 4 months (around 03/15/2020).   Total Time in Face-to-Face and in Coordination of Care for patient including independent/personal  interpretation/review of prior testing, medical history, examination, medication adjustment, communicating results with the patient directly, and documentation with the EHR is 25 minutes.   Justice Britain, MD [facility] Advanced Endoscopy Office # [phone]

## 2019-11-16 NOTE — Patient Instructions (Addendum)
If you are age 32 or younger, your body mass index should be between 19-25. Your Body mass index is 36.95 kg/m. If this is out of the aformentioned range listed, please consider follow up with your Primary Care Provider.   You have been scheduled for a CT scan of the abdomen and pelvis at [facility] are scheduled on 03/05/2020 at 10:30am. You should arrive 15 minutes prior to your appointment time for registration. Please follow the written instructions below on the day of your exam:  WARNING: IF YOU ARE ALLERGIC TO IODINE/X-RAY DYE, PLEASE NOTIFY RADIOLOGY IMMEDIATELY AT [phone]! YOU WILL BE GIVEN A 13 HOUR PREMEDICATION PREP.  1) Do not eat or drink anything after 6:30am (4 hours prior to your test) 2) You have been given 2 bottles of oral contrast to drink. The solution may taste better if refrigerated, but do NOT add ice or any other liquid to this solution. Shake well before drinking.    Drink 1 bottle of contrast @ 8:30am (2 hours prior to your exam)  Drink 1 bottle of contrast @ 9:30am (1 hour prior to your exam)  You may take any medications as prescribed with a small amount of water, if necessary. If you take any of the following medications: METFORMIN, GLUCOPHAGE, GLUCOVANCE, AVANDAMET, RIOMET, FORTAMET, [place] MET, JANUMET, GLUMETZA or METAGLIP, you MAY be asked to HOLD this medication 48 hours AFTER the exam.  The purpose of you drinking the oral contrast is to aid in the visualization of your intestinal tract. The contrast solution may cause some diarrhea. Depending on your individual set of symptoms, you may also receive an intravenous injection of x-ray contrast/dye. Plan on being at [facility]  for 30 minutes or longer, depending on the type of exam you are having performed.  This test typically takes 30-45 minutes to complete.  If you have any questions regarding your exam or if you need to reschedule, you may call the CT department at [phone] between the hours of 8:00 am and  5:00 pm, Monday-Friday.  ________________________________________________________________________    Dennis Bast will need to have a follow-up appointment 1-2 weeks after your scheduled CT. Our will contact    Thank you for choosing me and [facility].  Dr. Rush Landmark

## 2019-11-20 DIAGNOSIS — K863 Pseudocyst of pancreas: Secondary | ICD-10-CM | POA: Insufficient documentation

## 2019-11-20 DIAGNOSIS — K8689 Other specified diseases of pancreas: Secondary | ICD-10-CM | POA: Insufficient documentation

## 2020-03-05 ENCOUNTER — Ambulatory Visit (HOSPITAL_COMMUNITY)
Admission: RE | Admit: 2020-03-05 | Discharge: 2020-03-05 | Disposition: A | Discharge status: Home / Self Care | Payer: Commercial Managed Care - PPO | Source: Ambulatory Visit | Attending: Gastroenterology | Admitting: Gastroenterology

## 2020-03-05 ENCOUNTER — Other Ambulatory Visit: Payer: Self-pay

## 2020-03-05 DIAGNOSIS — K863 Pseudocyst of pancreas: Secondary | ICD-10-CM

## 2020-03-05 MED ORDER — SODIUM CHLORIDE (PF) 0.9 % IJ SOLN
INTRAMUSCULAR | Status: AC
  Filled 2020-03-05: qty 50

## 2020-03-05 MED ORDER — IOHEXOL 300 MG/ML  SOLN
100.0000 mL | Freq: Once | INTRAMUSCULAR | Status: AC | PRN
  Administered 2020-03-05: 100 mL via INTRAVENOUS

## 2020-04-13 ENCOUNTER — Encounter: Payer: Self-pay | Admitting: Gastroenterology

## 2020-04-13 ENCOUNTER — Ambulatory Visit: Payer: Commercial Managed Care - PPO | Admitting: Gastroenterology

## 2020-04-13 VITALS — BP 124/70 | HR 70 | Ht 73.0 in | Wt 253.0 lb

## 2020-04-13 DIAGNOSIS — R1013 Epigastric pain: Secondary | ICD-10-CM | POA: Diagnosis not present

## 2020-04-13 DIAGNOSIS — Z8719 Personal history of other diseases of the digestive system: Secondary | ICD-10-CM | POA: Diagnosis not present

## 2020-04-13 DIAGNOSIS — K8689 Other specified diseases of pancreas: Secondary | ICD-10-CM

## 2020-04-13 NOTE — Patient Instructions (Signed)
You have been scheduled for a CT scan of the abdomen and pelvis at [facility] ([address]---this is in the same building as [facility]).   You are scheduled on 08/22/20 at 9:30am You should arrive 15 minutes prior to your appointment time for registration. Please follow the written instructions below on the day of your exam:  WARNING: IF YOU ARE ALLERGIC TO IODINE/X-RAY DYE, PLEASE NOTIFY RADIOLOGY IMMEDIATELY AT [phone]! YOU WILL BE GIVEN A 13 HOUR PREMEDICATION PREP.  1) Do not eat  anything after 7:30am (2 hours prior to your test) - Stay hydrated with liquids only.  2) You have been given 1 bottles of oral contrast to drink. The solution may taste better if refrigerated, but do NOT add ice or any other liquid to this solution. Shake well before drinking.      Drink 1 bottle of contrast @ 8:30am (1 hour prior to your exam)  You may take any medications as prescribed with a small amount of water, if necessary. If you take any of the following medications: METFORMIN, GLUCOPHAGE, GLUCOVANCE, AVANDAMET, RIOMET, FORTAMET, ACTOPLUS MET, JANUMET, GLUMETZA or METAGLIP, you MAY be asked to HOLD this medication 48 hours AFTER the exam.  The purpose of you drinking the oral contrast is to aid in the visualization of your intestinal tract. The contrast solution may cause some diarrhea. Depending on your individual set of symptoms, you may also receive an intravenous injection of x-ray contrast/dye. Plan on being at [facility] for 30 minutes or longer, depending on the type of exam you are having performed.  This test typically takes 30-45 minutes to complete.  If you have any questions regarding your exam or if you need to reschedule, you may call the CT department at [phone] between the hours of 8:00 am and 5:00 pm, Monday-Friday.   If you are age 65 or older, your body mass index should be between 23-30. Your Body mass index is 33.38 kg/m. If this is out  of the aforementioned range listed, please consider follow up with your Primary Care Provider.  If you are age 64 or younger, your body mass index should be between 19-25. Your Body mass index is 33.38 kg/m. If this is out of the aformentioned range listed, please consider follow up with your Primary Care Provider.    Due to recent changes in healthcare laws, you may see the results of your imaging and laboratory studies on MyChart before your provider has had a chance to review them.  We understand that in some cases there may be results that are confusing or concerning to you. Not all laboratory results come back in the same time frame and the provider may be waiting for multiple results in order to interpret others.  Please give us 48 hours in order for your provider to thoroughly review all the results before contacting the office for clarification of your results.   We will see you back in the office 1-2 weeks after your CT scan. Office will call to schedule.   Thank you for choosing me and [facility].  Dr. Mansouraty    

## 2020-04-13 NOTE — Progress Notes (Signed)
Power VISIT   Primary Care Provider Etter Sjogren [place] [id number] [phone]   Patient Profile: Benjamin Gregory is a 34 y.o. male with a pmh significant for pancreatitis secondary to presumed biliary pathology, status post cholecystectomy, chronic pseudocyst.  The patient presents to the [facility] for an evaluation and management of problem(s) noted below:  Problem List 1. History of pancreatitis   2. Peripancreatic fluid collection   3. Epigastric pain     History of Present Illness Please see initial consultation note and progress notes for full details of HPI.    Interval History The patient returns for scheduled follow-up.  Over the course the last couple months he has had 2 episodes of acute abdominal pain that has been postprandial in origin.  He has had loose bowel movements with one of the episodes.  Subsequent relief of discomfort occurs within an hour of the discomfort.  He is not had any recurrent episodes for the last 6 weeks.  We had obtained imaging in May showing a persistent peripancreatic fluid collection that showed no evidence or concern for infection or inflammation.  Continues to have a stable weight.  He denies any significant nausea or vomiting except in those 2 episodes described earlier.    GI Review of Systems Positive as above Negative for odynophagia, dysphagia, bloating, early satiety melena, hematochezia  Review of Systems General: Denies fevers/chills/weight loss unintentionally Cardiovascular: Denies chest pain Pulmonary: Denies shortness of breath Gastroenterological: See HPI Genitourinary: Denies darkened urine Hematological: Denies easy bruising/bleeding Dermatological: Denies jaundice Psychological: Mood is excited (he is going to pick up his newest family member-a goldendoodle puppy this weekend)   Medications Current Outpatient Medications   Medication Sig Dispense Refill  . MULTIPLE VITAMIN PO 2 gummies daily     No current facility-administered medications for this visit.    Allergies No Known Allergies  Histories Past Medical History:  Diagnosis Date  . Gallstones   . Pseudocyst of pancreas    Past Surgical History:  Procedure Laterality Date  . CHOLECYSTECTOMY N/A 03/29/2019   Procedure: LAPAROSCOPIC CHOLECYSTECTOMY WITH INTRAOPERATIVE CHOLANGIOGRAM;  Surgeon: Ralene Ok, MD;  Location: [facility];  Service: General;  Laterality: N/A;   Social History   Socioeconomic History  . Marital status: Single    Spouse name: Not on file  . Number of children: 0  . Years of education: Not on file  . Highest education level: Not on file  Occupational History  . Occupation: Passenger transport manager  Tobacco Use  . Smoking status: Never Smoker  . Smokeless tobacco: Never Used  Vaping Use  . Vaping Use: Never used  Substance and Sexual Activity  . Alcohol use: Yes    Alcohol/week: 4.0 standard drinks    Types: 4 Standard drinks or equivalent per week  . Drug use: Never  . Sexual activity: Not on file  Other Topics Concern  . Not on file  Social History Narrative  . Not on file   Social Determinants of Health   Financial Resource Strain:   . Difficulty of Paying Living Expenses:   Food Insecurity:   . Worried About Charity fundraiser in the Last Year:   . Arboriculturist in the Last Year:   Transportation Needs:   . Film/video editor (Medical):   Marland Kitchen Lack of Transportation (Non-Medical):   Physical Activity:   . Days of Exercise per Week:   . Minutes of Exercise  per Session:   Stress:   . Feeling of Stress :   Social Connections:   . Frequency of Communication with Friends and Family:   . Frequency of Social Gatherings with Friends and Family:   . Attends Religious Services:   . Active Member of Clubs or Organizations:   . Attends Banker Meetings:   Marland Kitchen Marital Status:   Intimate Partner  Violence:   . Fear of Current or Ex-Partner:   . Emotionally Abused:   Marland Kitchen Physically Abused:   . Sexually Abused:    Family History  Problem Relation Age of Onset  . Hypertension Father   . Colon cancer Neg Hx   . Esophageal cancer Neg Hx   . Inflammatory bowel disease Neg Hx   . Liver disease Neg Hx   . Pancreatic cancer Neg Hx   . Rectal cancer Neg Hx   . Stomach cancer Neg Hx    I have reviewed his medical, social, and family history in detail and updated the electronic medical record as necessary.    PHYSICAL EXAMINATION  BP 124/70   Pulse 70   Ht 6\' 1"  (1.854 m)   Wt 253 lb (114.8 kg)   BMI 33.38 kg/m  GEN: NAD, appears stated age, doesn't appear chronically ill PSYCH: Cooperative, without pressured speech EYE: Conjunctivae pink, sclerae anicteric  ENT: MMM CV: Nontachycardic RESP: No audible wheezing GI: NABS, soft, nontender, no rebound MSK/EXT: No lower extremity edema SKIN: No jaundice NEURO:  Alert & Oriented x 3, no focal deficits   REVIEW OF DATA  I reviewed the following data at the time of this encounter:  GI Procedures and Studies  No relevant studies to review  Laboratory Studies  Reviewed those in epic  Imaging Studies  May 2021 CT abdomen pelvis IMPRESSION: 1. Minimally decreased size of large, mildly complex intraparenchymal pancreatic pseudocyst with stable dependent debris. No evidence of recurrent pancreatitis or new/enlarging peripancreatic fluid collections. 2. No significant biliary dilatation post cholecystectomy. 3. No acute findings.   ASSESSMENT  Mr. Dehaas is a 34 y.o. male with a pmh significant for pancreatitis secondary to presumed biliary pathology, status post cholecystectomy, chronic pseudocyst.  The patient is seen today for evaluation and management of:  1. History of pancreatitis   2. Peripancreatic fluid collection   3. Epigastric pain    The patient is hemodynamically and clinically stable.  He has had 2 separate  episodes of abdominal pain with associated nausea and vomiting weeks apart.  They seem to be most likely consistent with episodes of food poisoning rather than a issue with the chronic pseudocyst.  Is been minimal size changed.  It is not clear that he is truly symptomatic from this.  The patient continues to have recurrent atypical episodes of abdominal pain he would let 38 know when that may cause [place] to proceed with earlier follow-up imaging.  Otherwise he continues to do well without significant weight loss or persisting symptoms of more chronic nausea and we will plan a follow-up CT abdomen in 6 months from his last (November 2021).  All patient questions were answered, to the best of my ability, and the patient agrees to the aforementioned plan of action with follow-up as indicated.   PLAN  CT abdomen with IV and oral contrast in November 2021 If patient develops symptoms concerning for symptomatic pseudocyst, will need more urgent CT abdomen/pelvis   Orders Placed This Encounter  Procedures  . CT ABDOMEN W WO CONTRAST  New Prescriptions   No medications on file   Modified Medications   No medications on file    Planned Follow Up No follow-ups on file.   Total Time in Face-to-Face and in Coordination of Care for patient including independent/personal interpretation/review of prior testing, medical history, examination, medication adjustment, communicating results with the patient directly, and documentation with the EHR is 25 minutes.   Corliss Parish, MD [facility] Advanced [facility]oscopy Office # [phone]

## 2020-04-15 ENCOUNTER — Encounter: Payer: Self-pay | Admitting: Gastroenterology

## 2020-04-15 DIAGNOSIS — R1013 Epigastric pain: Secondary | ICD-10-CM | POA: Insufficient documentation

## 2020-08-22 ENCOUNTER — Ambulatory Visit (INDEPENDENT_AMBULATORY_CARE_PROVIDER_SITE_OTHER)
Admission: RE | Admit: 2020-08-22 | Discharge: 2020-08-22 | Disposition: A | Discharge status: Home / Self Care | Payer: Commercial Managed Care - PPO | Source: Ambulatory Visit | Attending: Gastroenterology | Admitting: Gastroenterology

## 2020-08-22 ENCOUNTER — Other Ambulatory Visit: Payer: Self-pay

## 2020-08-22 DIAGNOSIS — K8689 Other specified diseases of pancreas: Secondary | ICD-10-CM

## 2020-08-22 DIAGNOSIS — Z8719 Personal history of other diseases of the digestive system: Secondary | ICD-10-CM

## 2020-08-22 MED ORDER — IOHEXOL 300 MG/ML  SOLN
100.0000 mL | Freq: Once | INTRAMUSCULAR | Status: AC | PRN
  Administered 2020-08-22: 100 mL via INTRAVENOUS

## 2020-10-18 ENCOUNTER — Ambulatory Visit: Payer: Commercial Managed Care - PPO | Admitting: Gastroenterology

## 2020-10-18 ENCOUNTER — Encounter: Payer: Self-pay | Admitting: Gastroenterology

## 2020-10-18 VITALS — BP 114/78 | HR 83 | Ht 71.0 in | Wt 285.0 lb

## 2020-10-18 DIAGNOSIS — Z8719 Personal history of other diseases of the digestive system: Secondary | ICD-10-CM

## 2020-10-18 DIAGNOSIS — K8689 Other specified diseases of pancreas: Secondary | ICD-10-CM | POA: Diagnosis not present

## 2020-10-18 DIAGNOSIS — K863 Pseudocyst of pancreas: Secondary | ICD-10-CM | POA: Diagnosis not present

## 2020-10-18 NOTE — Patient Instructions (Signed)
Follow-up in 1 year , sooner if needed.   You will need a CT Scan abdomen / pelvis 1-2 weeks prior to your 1 year follow-up appointment.   If you are age 32 or older, your body mass index should be between 23-30. Your Body mass index is 39.75 kg/m. If this is out of the aforementioned range listed, please consider follow up with your Primary Care Provider.  If you are age 47 or younger, your body mass index should be between 19-25. Your Body mass index is 39.75 kg/m. If this is out of the aformentioned range listed, please consider follow up with your Primary Care Provider.    Thank you for choosing me and [facility].  Dr. Meridee Score

## 2020-10-21 ENCOUNTER — Encounter: Payer: Self-pay | Admitting: Gastroenterology

## 2020-10-21 DIAGNOSIS — K863 Pseudocyst of pancreas: Secondary | ICD-10-CM | POA: Insufficient documentation

## 2020-10-21 NOTE — Progress Notes (Signed)
GASTROENTEROLOGY OUTPATIENT CLINIC VISIT   Primary Care Provider Livengood, Katheren Shams, PA-C (Inactive) No address on file None   Patient Profile: Benjamin Gregory is a 35 y.o. male with a pmh significant for pancreatitis secondary to presumed biliary pathology, status post cholecystectomy, chronic pseudocyst.  The patient presents to the [facility] for an evaluation and management of problem(s) noted below:  Problem List 1. Pancreatic pseudocyst   2. History of pancreatitis   3. History of Pancreatic necrosis     History of Present Illness Please see initial consultation note and prior progress notes for full details of HPI.    Interval History Today, the patient returns for scheduled follow-up.  The patient continues to do well at this time and has not had any significant abdominal pain.  He denies any early satiety or nausea or vomiting.  His weight has remained stable.  Repeat cross-sectional imaging has been obtained to evaluate his chronic pseudocyst.  This shows an overall stability to slight decrease in size of the large cyst.  He denies any fevers or chills.  No changes in his bowel habits.  GI Review of Systems Positive as above Negative for dysphagia, odynophagia, pain, bloating, melena, hematochezia   Review of Systems General: Denies fevers/chills/weight loss unintentionally Cardiovascular: Denies chest pain Pulmonary: Denies shortness of breath Gastroenterological: See HPI Genitourinary: Denies darkened urine Hematological: Denies easy bruising/bleeding Dermatological: Denies jaundice Psychological: Mood is stable   Medications Current Outpatient Medications  Medication Sig Dispense Refill  . MULTIPLE VITAMIN PO 2 gummies daily     No current facility-administered medications for this visit.    Allergies No Known Allergies  Histories Past Medical History:  Diagnosis Date  . Gallstones   . Pseudocyst of pancreas    Past Surgical  History:  Procedure Laterality Date  . CHOLECYSTECTOMY N/A 03/29/2019   Procedure: LAPAROSCOPIC CHOLECYSTECTOMY WITH INTRAOPERATIVE CHOLANGIOGRAM;  Surgeon: Axel Filler, MD;  Location: [facility];  Service: General;  Laterality: N/A;   Social History   Socioeconomic History  . Marital status: Single    Spouse name: Not on file  . Number of children: 0  . Years of education: Not on file  . Highest education level: Not on file  Occupational History  . Occupation: Scientist, water quality  Tobacco Use  . Smoking status: Never Smoker  . Smokeless tobacco: Never Used  Vaping Use  . Vaping Use: Never used  Substance and Sexual Activity  . Alcohol use: Yes    Alcohol/week: 4.0 standard drinks    Types: 4 Standard drinks or equivalent per week  . Drug use: Never  . Sexual activity: Not on file  Other Topics Concern  . Not on file  Social History Narrative  . Not on file   Social Determinants of Health   Financial Resource Strain: Not on file  Food Insecurity: Not on file  Transportation Needs: Not on file  Physical Activity: Not on file  Stress: Not on file  Social Connections: Not on file  Intimate Partner Violence: Not on file   Family History  Problem Relation Age of Onset  . Hypertension Father   . Colon cancer Neg Hx   . Esophageal cancer Neg Hx   . Inflammatory bowel disease Neg Hx   . Liver disease Neg Hx   . Pancreatic cancer Neg Hx   . Rectal cancer Neg Hx   . Stomach cancer Neg Hx    I have reviewed his medical, social, and family history in detail  and updated the electronic medical record as necessary.    PHYSICAL EXAMINATION  BP 114/78   Pulse 83   Ht 5\' 11"  (1.803 m)   Wt 285 lb (129.3 kg)   BMI 39.75 kg/m  GEN: NAD, appears stated age, doesn't appear chronically ill PSYCH: Cooperative, without pressured speech EYE: Conjunctivae pink, sclerae anicteric  ENT: Masked CV: Nontachycardic RESP: No audible wheezing GI: NABS, soft, protuberant abdomen,  nontender, no rebound or guarding MSK/EXT: No lower extremity edema SKIN: No jaundice NEURO:  Alert & Oriented x 3, no focal deficits   REVIEW OF DATA  I reviewed the following data at the time of this encounter:  GI Procedures and Studies  No relevant studies to review  Laboratory Studies  Reviewed those in epic  Imaging Studies  November 2021 CT abdomen IMPRESSION: 1. Minimal decrease in size of pancreatic parenchymal complex cystic lesion, likely chronic pseudocyst. 2. No acute superimposed process. Cholecystectomy without biliary duct dilatation.   ASSESSMENT  Mr. Benjamin Gregory is a 35 y.o. male with a pmh significant for pancreatitis secondary to presumed biliary pathology, status post cholecystectomy, chronic pseudocyst.  The patient is seen today for evaluation and management of:  1. Pancreatic pseudocyst   2. History of pancreatitis   3. History of Pancreatic necrosis    The patient remains hemodynamically and clinically stable.  No further bouts of abdominal discomfort as he had previously had earlier this year.  Weight remains stable.  It looks like this chronic pseudocyst is not currently symptomatic in any sense of the normal reasoning for which we would think about sampling/decompressing.  I discussed his case briefly with a few of my former colleagues at [facility] advanced endoscopy and received mixed thoughts on regards of monitoring versus decompressing.  The patient has had no symptoms to suggest overt need for decompression at this time and for now we will plan to hold on decompressing things unless he has new symptoms that suggest symptomatic pseudocyst.  We know he had prior imaging at the onset of his pancreatitis which did not show evidence of a cyst so the likelihood of a premalignant cyst being present is extremely unlikely although we cannot say with 100% certainty without sampling in the future.  My plan will be for a 1 year follow-up with repeat cross-sectional imaging at  that time.  If the patient become symptomatic he will alert [facility] and we will decide on earlier imaging and consideration of decompressive techniques.  All patient questions were answered to the best of my ability, and the patient agrees to the aforementioned plan of action with follow-up as indicated.   PLAN  CT abdomen with IV and oral contrast in November 2022 Follow-up in clinic after CT scan in 2022 If patient develops symptoms concerning for symptomatic pseudocyst, will need earlier CT abdomen/pelvis with potential for decompressive techniques   No orders of the defined types were placed in this encounter.   New Prescriptions   No medications on file   Modified Medications   No medications on file    Planned Follow Up Return in about 1 year (around 10/18/2021).   Total Time in Face-to-Face and in Coordination of Care for patient including independent/personal interpretation/review of prior testing, medical history, examination, medication adjustment, communicating results with the patient directly, and documentation with the EHR is 20 minutes.   10/20/2021, MD [facility] Advanced [facility]oscopy Office # Corliss Parish

## 2021-10-06 IMAGING — CT CT ABD-PELV W/ CM
2 of 4 series · 16 of 46 positions shown, 18 images · IV contrast (omnipaque)
Comparison: Abdominal MRI 09/02/2019 and 03/26/2019. Abdominopelvic
CT 06/24/2019.

CLINICAL DATA: Follow-up pancreatic pseudocyst. No current
complaints.

EXAM:
CT ABDOMEN AND PELVIS WITH CONTRAST
TECHNIQUE: Multidetector CT imaging of the abdomen and pelvis was performed
using the standard protocol following bolus administration of
intravenous contrast.
CONTRAST:  100mL OMNIPAQUE IOHEXOL 300 MG/ML  SOLN

[Series 2: axial st · axial · 0.97mm/px · z∈[-648,-198]mm · 13 of 103 slices shown, 15 images]
[im 7/103  soft-tissue]
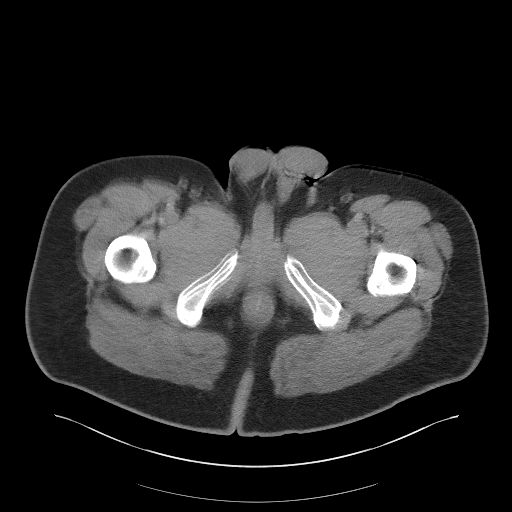
[im 7/103  bone]
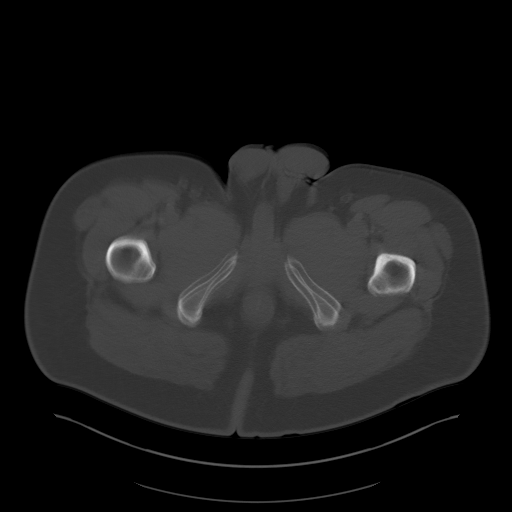
[im 13/103  soft-tissue]
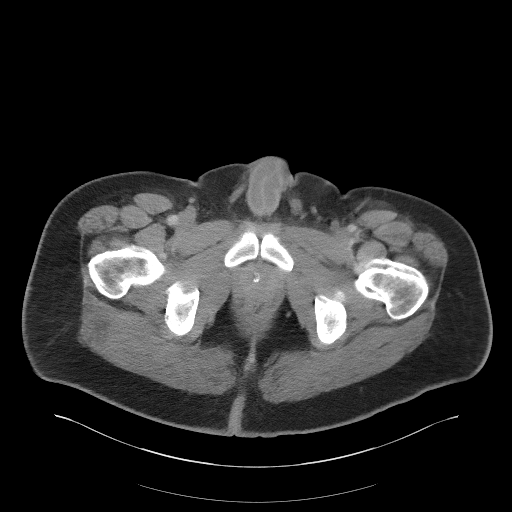
[im 25/103  soft-tissue]
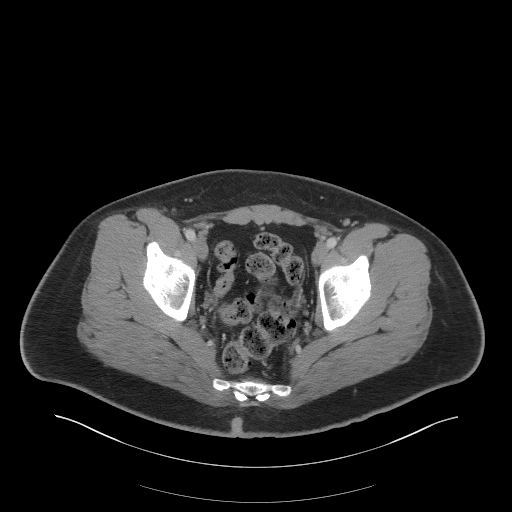
[im 31/103  soft-tissue]
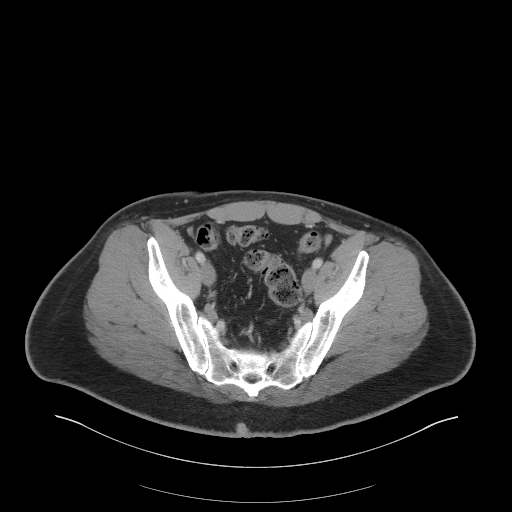
[im 37/103  soft-tissue]
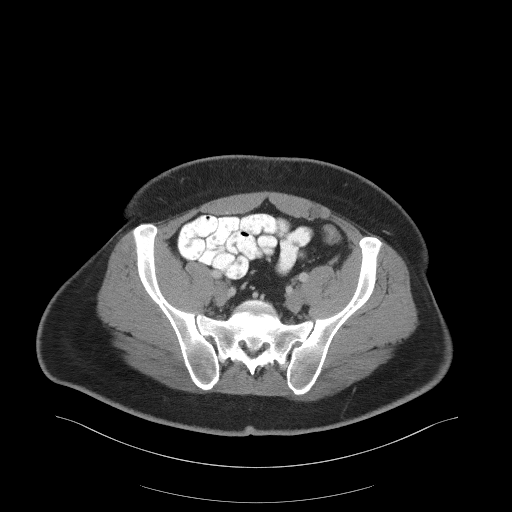
[im 43/103  soft-tissue]
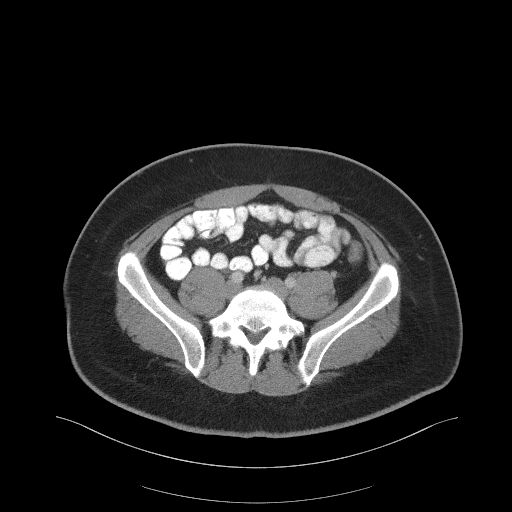
[im 55/103  soft-tissue]
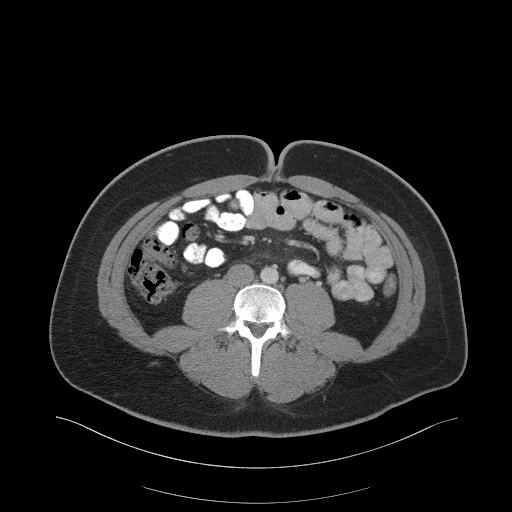
[im 61/103  soft-tissue]
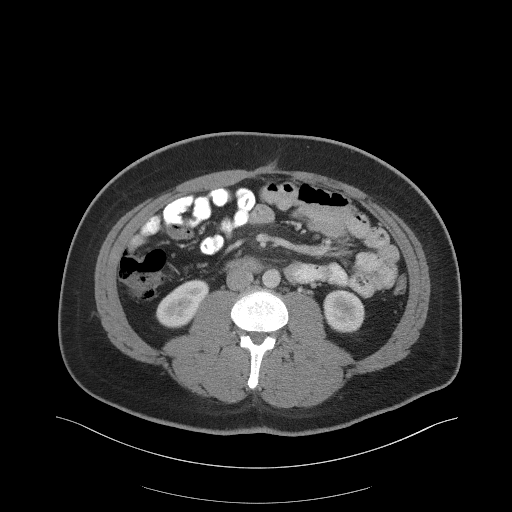
[im 67/103  soft-tissue]
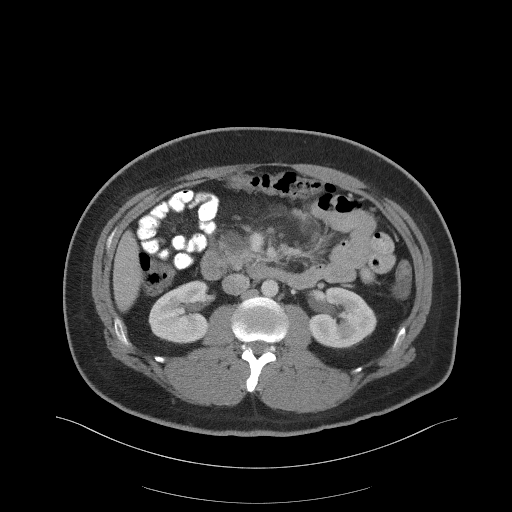
[im 67/103  bone]
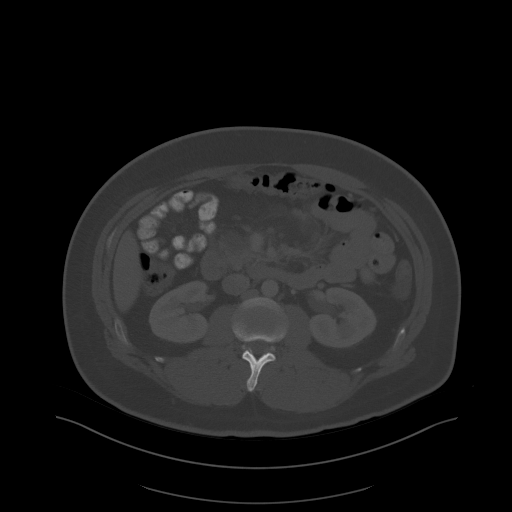
[im 73/103  soft-tissue]
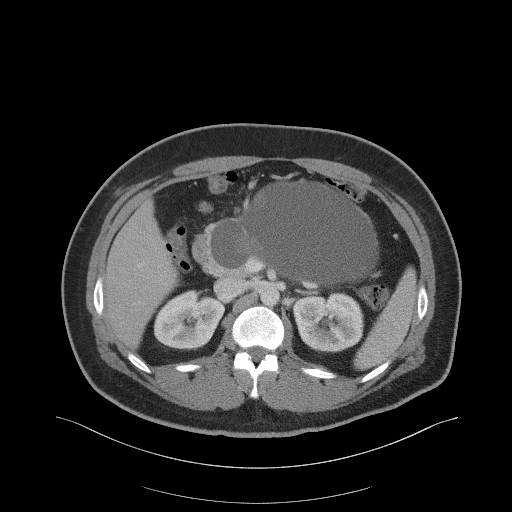
[im 79/103  soft-tissue]
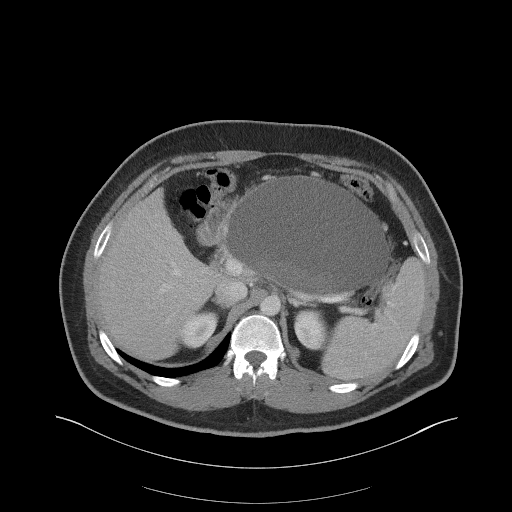
[im 91/103  soft-tissue]
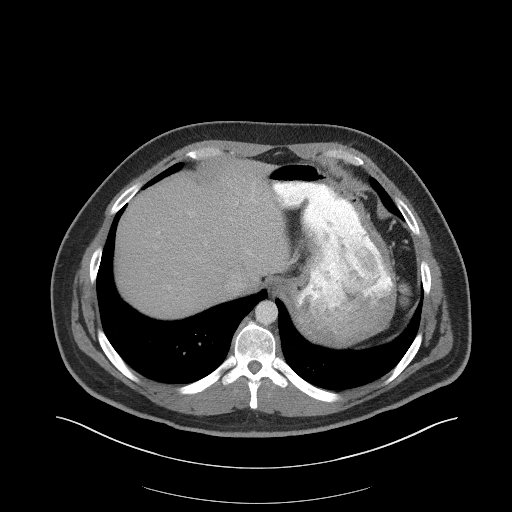
[im 97/103  soft-tissue]
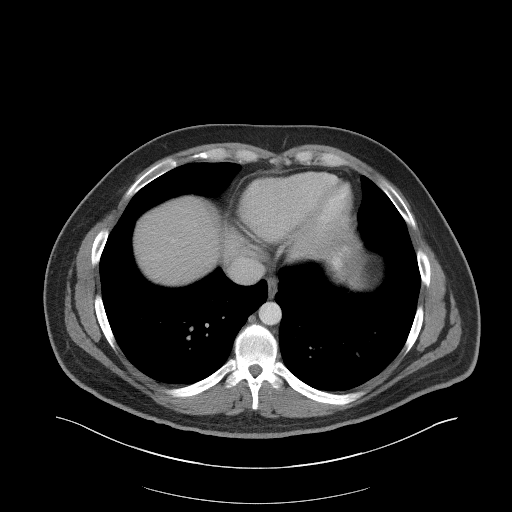

[Series 4: coronal st · coronal · 0.91mm/px · 3 of 87 slices shown]
[im 29/87  soft-tissue]
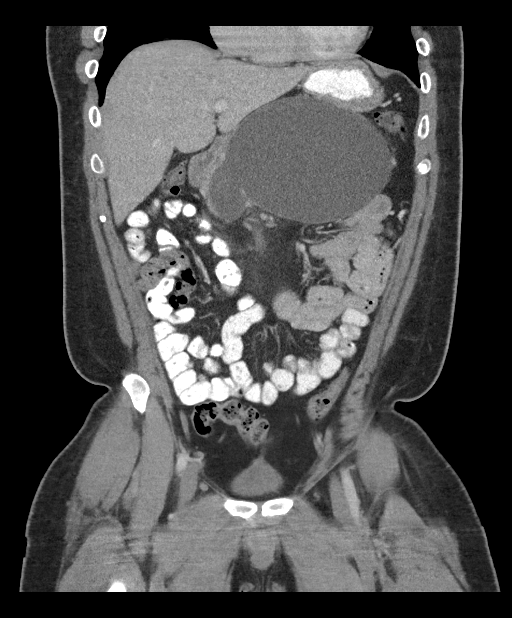
[im 39/87  soft-tissue]
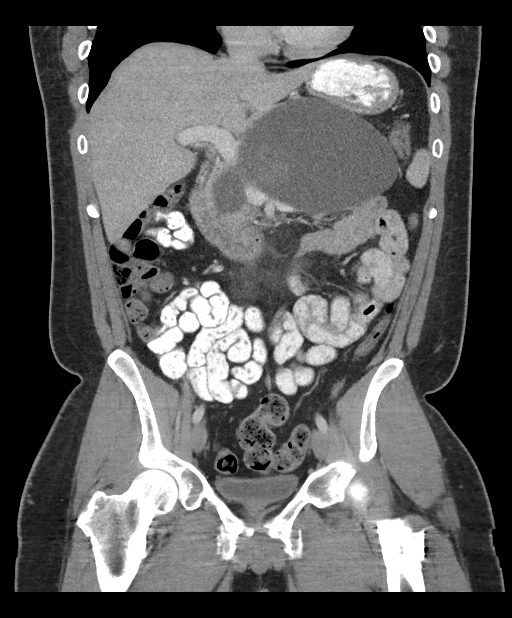
[im 48/87  soft-tissue]
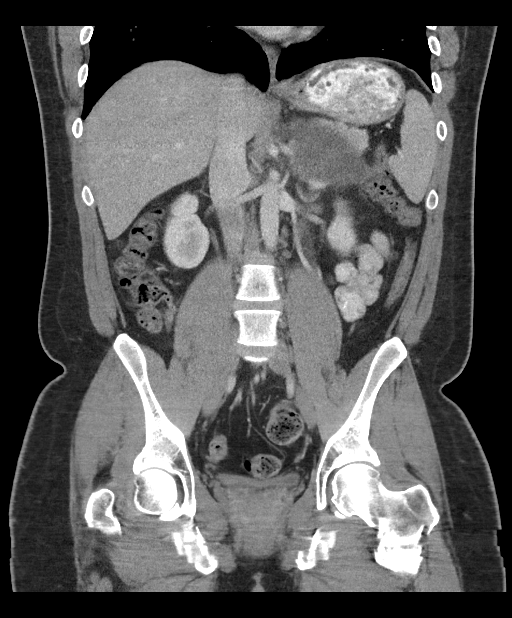

[16 of 46 positions shown; findings below may reference images not displayed]

FINDINGS: Lower chest: Clear lung bases. No significant pleural or pericardial
effusion.

Hepatobiliary: The liver is normal in density without suspicious
focal abnormality. No significant biliary dilatation post
cholecystectomy.

Pancreas: Again demonstrated is a large, mildly complex
intraparenchymal pancreatic pseudocyst. The dominant component
measures 15.9 x 11.1 cm on image [DATE] (previously 17.2 x 11.0 cm). A
component extending medially into the pancreatic head is slightly
smaller, measuring 4.5 cm on image 32/2 (previously 6.7 cm).
Dependent debris within the pseudocyst is unchanged. The cyst
otherwise has thin walls and no surrounding inflammation. The
pancreas is diffusely atrophied. No new or enlarging peripancreatic
fluid collections.

Spleen: Normal in size without focal abnormality.

Adrenals/Urinary Tract: Both adrenal glands appear normal. The
kidneys appear normal without evidence of urinary tract calculus,
suspicious lesion or hydronephrosis. No bladder abnormalities are
seen.

Stomach/Bowel: No evidence of bowel wall thickening, distention or
surrounding inflammatory change. There is improved mass effect on
the duodenum by the pseudocyst, and no surrounding inflammatory
change. The appendix appears normal.

Vascular/Lymphatic: There are no enlarged abdominal or pelvic lymph
nodes. No significant vascular findings. The portal, superior
mesenteric and splenic veins remain patent.

Reproductive: The prostate gland and seminal vesicles appear normal.

Other: No evidence of abdominal wall mass or hernia. No ascites.

Musculoskeletal: No acute or significant osseous findings.
IMPRESSION: 1. Minimally decreased size of large, mildly complex
intraparenchymal pancreatic pseudocyst with stable dependent debris.
No evidence of recurrent pancreatitis or new/enlarging
peripancreatic fluid collections.
2. No significant biliary dilatation post cholecystectomy.
3. No acute findings.

## 2022-03-25 IMAGING — CT CT ABDOMEN W/ CM
2 of 4 series · 15 of 46 positions shown, 17 images · IV contrast (OMNIPAQUE 300)
Comparison: 03/05/2020

CLINICAL DATA: Follow-up of pancreatic pseudocyst. History of
pancreatitis secondary to presumed biliary pathology.
Cholecystectomy.

EXAM:
CT ABDOMEN WITH CONTRAST
TECHNIQUE: Multidetector CT imaging of the abdomen was performed using the
standard protocol following bolus administration of intravenous
contrast.
CONTRAST:  100mL OMNIPAQUE IOHEXOL 300 MG/ML  SOLN

[Series 2: abdomen w 5.0 br40 2 · axial · 0.86mm/px · z∈[-365,-80]mm · 12 of 63 slices shown, 14 images]
[im 3/63  soft-tissue]
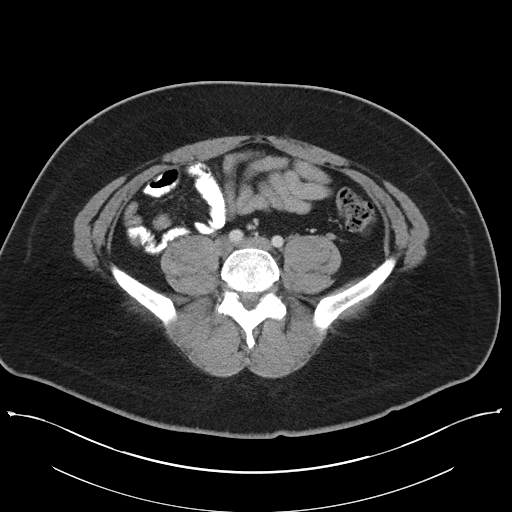
[im 3/63  bone]
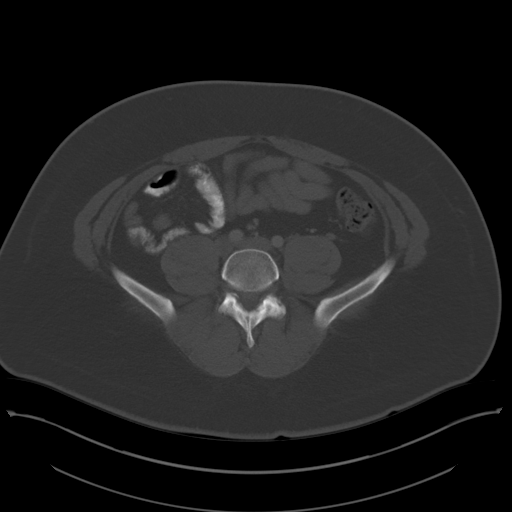
[im 9/63  soft-tissue]
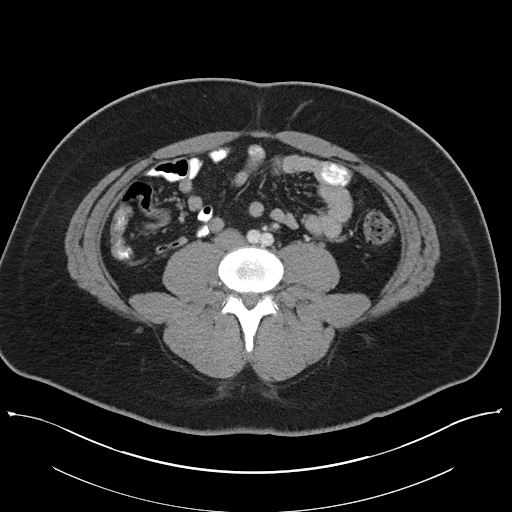
[im 15/63  soft-tissue]
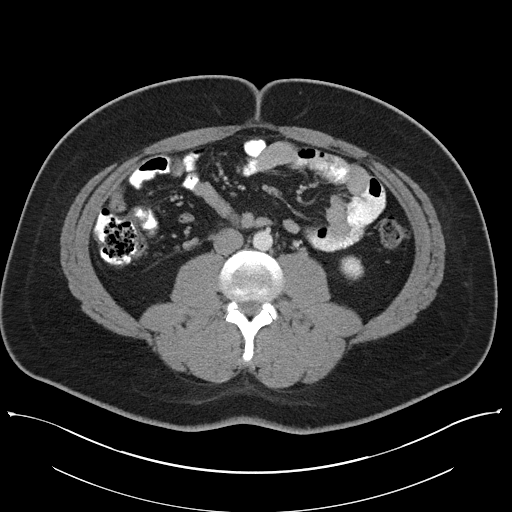
[im 18/63  soft-tissue]
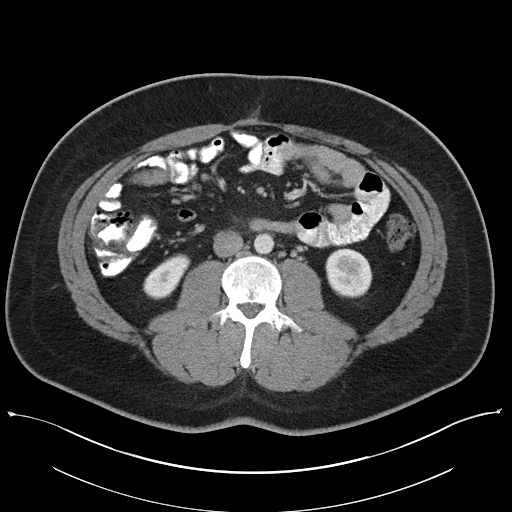
[im 24/63  soft-tissue]
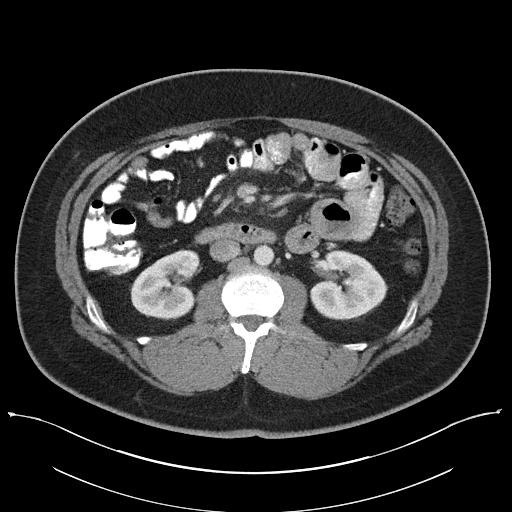
[im 30/63  soft-tissue]
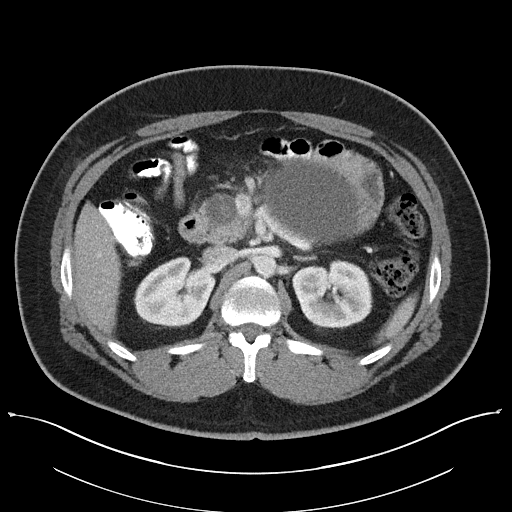
[im 33/63  soft-tissue]
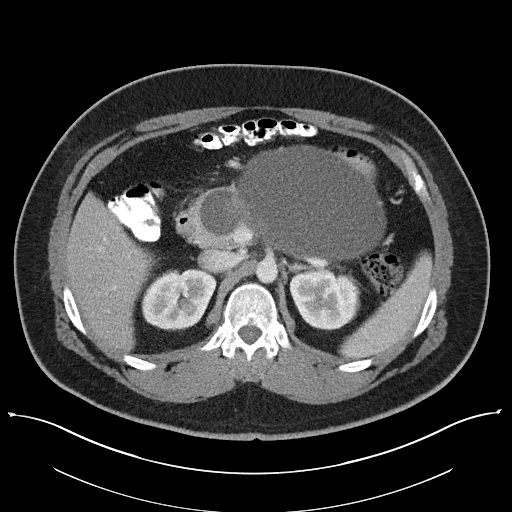
[im 39/63  soft-tissue]
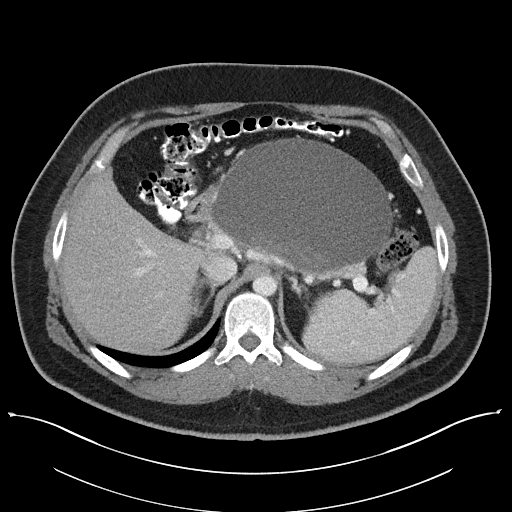
[im 45/63  soft-tissue]
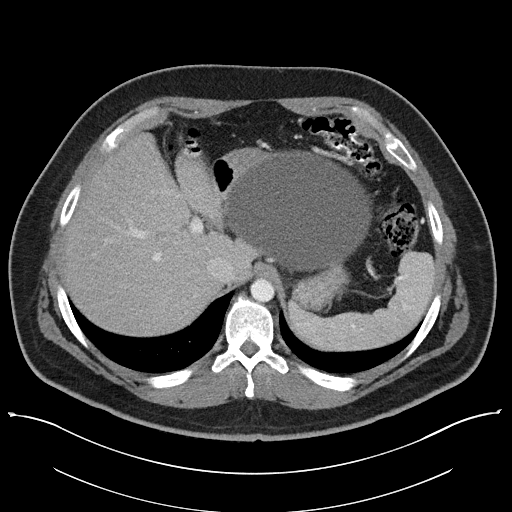
[im 45/63  bone]
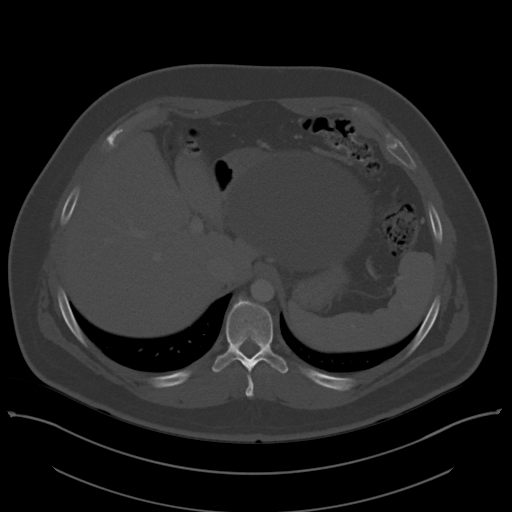
[im 48/63  soft-tissue]
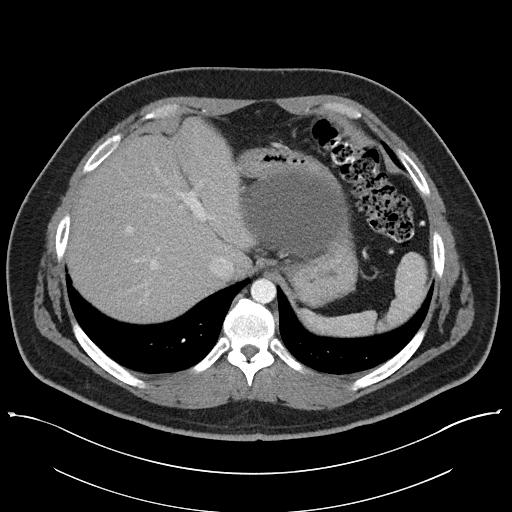
[im 54/63  soft-tissue]
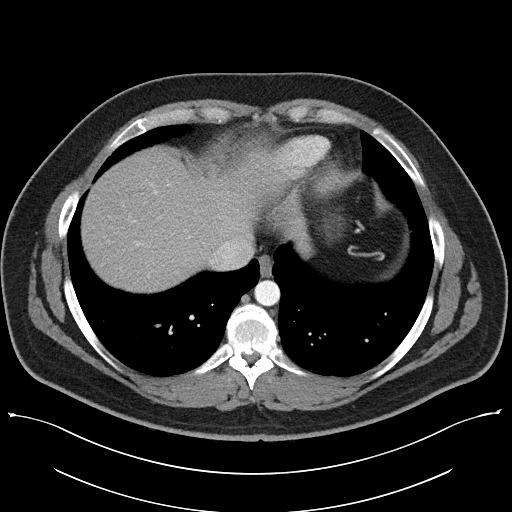
[im 60/63  soft-tissue]
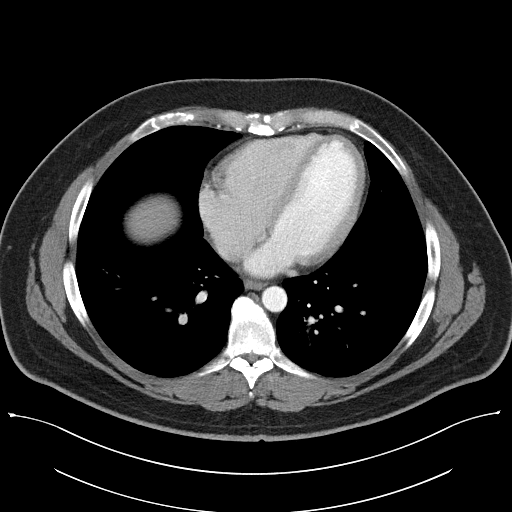

[Series 5: coronal · coronal · 0.62mm/px · 3 of 107 slices shown]
[im 36/107  soft-tissue]
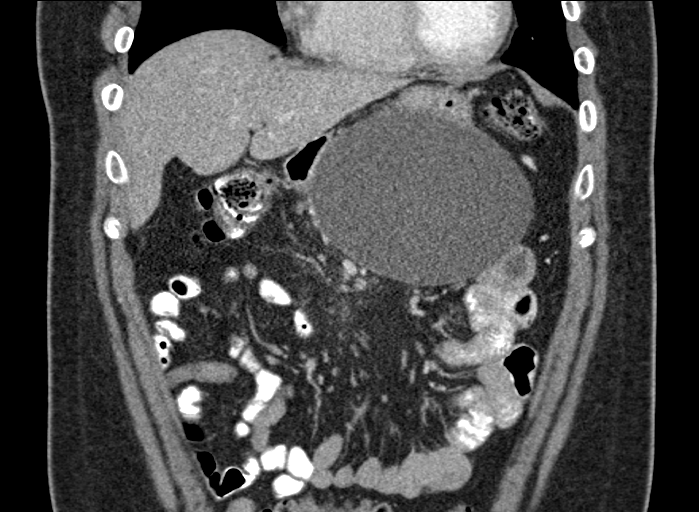
[im 48/107  soft-tissue]
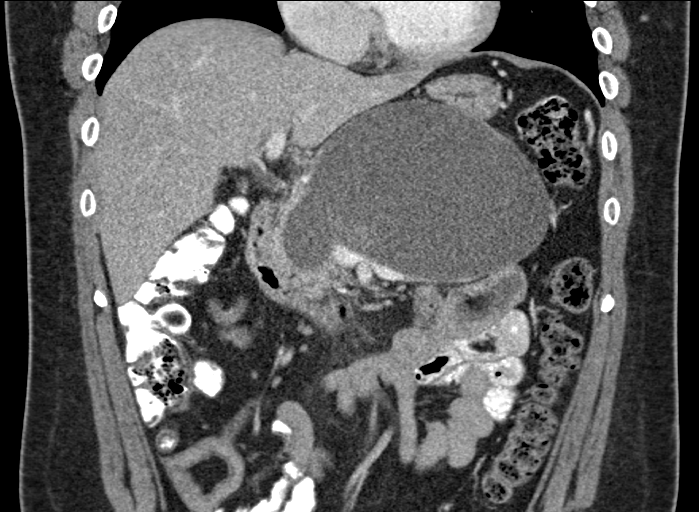
[im 59/107  soft-tissue]
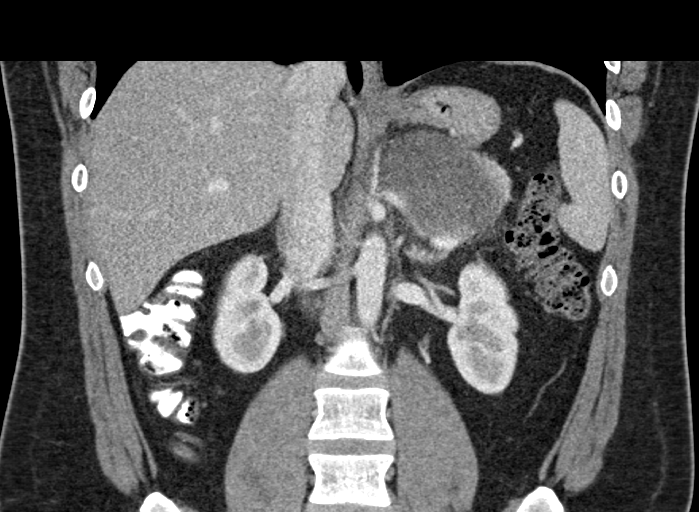

[15 of 46 positions shown; findings below may reference images not displayed]

FINDINGS: Lower chest: Clear lung bases. Normal heart size without pericardial
or pleural effusion.

Hepatobiliary: Normal liver. Cholecystectomy, without biliary ductal
dilatation.

Pancreas: The bilobed pancreatic parenchymal cystic lesion with
extension into the lesser sac is again identified. When measured at
the same level and in a similar fashion to on the prior, 14.6 x
cm on [DATE]. Compare 15.9 x 11.1 cm on the prior exam.

The component which extends into the pancreatic head measures 3.5 x
3.6 cm on [DATE] versus 4.5 x 3.8 cm on the prior exam (when
remeasured). Based on coronal reformats, measures 16.3 x 10.9 cm on
45/5 versus 17.4 x 11.6 cm on the prior exam (when remeasured).

Again identified is peripheral, primarily dependent complexity as
evidenced by relative hyperattenuation. No gas within. No new
parenchymal or peripancreatic collections. No pancreatic necrosis or
acute inflammation.

Spleen: Normal in size, without focal abnormality.

Adrenals/Urinary Tract: Normal adrenal glands. Normal kidneys,
without hydronephrosis.

Stomach/Bowel: Similar mass-effect upon the gastric body with no
evidence of outlet obstruction. Normal colon, appendix, and terminal
ileum. Normal small bowel.

Vascular/Lymphatic: Normal caliber of the aorta and branch vessels.
Mildly prominent small bowel mesenteric nodes are likely reactive.

Other: No ascites.

Musculoskeletal: Degenerate disc disease at the L5-S1 level
IMPRESSION: 1. Minimal decrease in size of pancreatic parenchymal complex cystic
lesion, likely chronic pseudocyst.
2. No acute superimposed process. Cholecystectomy without biliary
duct dilatation.

## 2022-05-26 ENCOUNTER — Encounter (HOSPITAL_BASED_OUTPATIENT_CLINIC_OR_DEPARTMENT_OTHER): Payer: Self-pay

## 2022-05-26 ENCOUNTER — Encounter (HOSPITAL_BASED_OUTPATIENT_CLINIC_OR_DEPARTMENT_OTHER): Payer: Self-pay | Admitting: Family Medicine

## 2022-05-26 ENCOUNTER — Ambulatory Visit (INDEPENDENT_AMBULATORY_CARE_PROVIDER_SITE_OTHER): Payer: BC Managed Care – PPO | Admitting: Family Medicine

## 2022-05-26 DIAGNOSIS — K863 Pseudocyst of pancreas: Secondary | ICD-10-CM

## 2022-05-26 DIAGNOSIS — M25571 Pain in right ankle and joints of right foot: Secondary | ICD-10-CM | POA: Diagnosis not present

## 2022-05-26 DIAGNOSIS — Z0000 Encounter for general adult medical examination without abnormal findings: Secondary | ICD-10-CM

## 2022-05-26 NOTE — Patient Instructions (Signed)
  Medication Instructions:  Your physician recommends that you continue on your current medications as directed. Please refer to the Current Medication list given to you today. --If you need a refill on any your medications before your next appointment, please call your pharmacy first. If no refills are authorized on file call the office.-- Lab Work: Your physician has recommended that you have lab work today: No If you have labs (blood work) drawn today and your tests are completely normal, you will receive your results via MyChart message OR a phone call from our staff.  Please ensure you check your voicemail in the event that you authorized detailed messages to be left on a delegated number. If you have any lab test that is abnormal or we need to change your treatment, we will call you to review the results.  Referrals/Procedures/Imaging: No  Follow-Up: Your next appointment:   Your physician recommends that you schedule a follow-up appointment in: 1-2 month cpe with Dr. de [place].  You will receive a text message or e-mail with a link to a survey about your care and experience with [place] today! We would greatly appreciate your feedback!   Thanks for letting us be apart of your health journey!!  Primary Care and Sports Medicine   Dr. Ceasar Mons [place]   We encourage you to activate your patient portal called "MyChart".  Sign up information is provided on this After Visit Summary.  MyChart is used to connect with patients for Virtual Visits (Telemedicine).  Patients are able to view lab/test results, encounter notes, upcoming appointments, etc.  Non-urgent messages can be sent to your provider as well. To learn more about what you can do with MyChart, please visit --  [email or web address].

## 2022-05-26 NOTE — Progress Notes (Signed)
New Patient Office Visit  Subjective    Patient ID: Benjamin Gregory, male    DOB: 1986/08/02  Age: 36 y.o. MRN: [id number]  CC:  Chief Complaint  Patient presents with   New Patient (Initial Visit)    Pt here to establish new care     HPI Benjamin Gregory presents to establish care Last PCP - cannot recall  History of pancreatitis/pancreatic pseudocyst.  About 3 years ago, patient was having abdominal pain and was diagnosed with gallstone pancreatitis, choledocholithiasis.  He underwent laparoscopic cholecystectomy, and had follow-up with GI related to pancreatic pseudocyst.  Last visit with GI provider was in December 2021, recommendation at that time was for 1 year follow-up and repeat imaging in 1 year for monitoring of pseudocyst.  Unfortunately, he has had follow-up with GI.  He currently is asymptomatic, denies any issues with abdominal pain.  Reports having some lateral right ankle pain.  Has been present over the past few weeks, generally feels that it has been improving.  Has not noticed any ankle instability.  No specific aggravating factors.  Occasionally will be uncomfortable with certain movements or at end of range of motion, particularly with inversion and plantarflexion.  Has not noticed any swelling or bruising.  No injury recalled.  Patient is originally from [place], [place]. Has been here for 6 years. Works for [employer]. Outside of work, spend time with family/friends, video games.  Outpatient Encounter Medications as of 05/26/2022  Medication Sig   MULTIPLE VITAMIN PO 2 gummies daily   No facility-administered encounter medications on file as of 05/26/2022.    Past Medical History:  Diagnosis Date   Gallstones    Pseudocyst of pancreas     Past Surgical History:  Procedure Laterality Date   CHOLECYSTECTOMY N/A 03/29/2019   Procedure: LAPAROSCOPIC CHOLECYSTECTOMY WITH INTRAOPERATIVE CHOLANGIOGRAM;  Surgeon: Axel Filler, MD;  Location: [facility];  Service: General;   Laterality: N/A;    Family History  Problem Relation Age of Onset   Hypertension Father    Colon cancer Neg Hx    Esophageal cancer Neg Hx    Inflammatory bowel disease Neg Hx    Liver disease Neg Hx    Pancreatic cancer Neg Hx    Rectal cancer Neg Hx    Stomach cancer Neg Hx     Social History   Socioeconomic History   Marital status: Single    Spouse name: Not on file   Number of children: 0   Years of education: Not on file   Highest education level: Not on file  Occupational History   Occupation: Scientist, water quality  Tobacco Use   Smoking status: Never   Smokeless tobacco: Never  Vaping Use   Vaping Use: Never used  Substance and Sexual Activity   Alcohol use: Yes    Alcohol/week: 4.0 standard drinks of alcohol    Types: 4 Standard drinks or equivalent per week   Drug use: Never   Sexual activity: Not on file  Other Topics Concern   Not on file  Social History Narrative   Not on file   Social Determinants of Health   Financial Resource Strain: Not on file  Food Insecurity: Not on file  Transportation Needs: Not on file  Physical Activity: Not on file  Stress: Not on file  Social Connections: Not on file  Intimate Partner Violence: Not on file    Objective    BP 134/88   Pulse 81   Temp 97.8 F (36.6  C) (Oral)   Ht 5\' 11"  (1.803 m)   Wt 278 lb 6.4 oz (126.3 kg)   SpO2 100%   BMI 38.83 kg/m   Physical Exam  36 year old male in no acute distress Cardiovascular exam with regular rate and rhythm, no murmur appreciated Lungs clear to auscultation bilaterally Right ankle with no tenderness to palpation about the ankle.  Normal active and passive range of motion.  Normal strength for plantarflexion, dorsiflexion, inversion and eversion, no pain with resisted movement.  Assessment & Plan:   Problem List Items Addressed This Visit       Digestive   Pancreatic pseudocyst    Patient overdue for follow-up with GI provider as well as imaging.  Discussed  recommendation to schedule follow-up with gastroenterologist, provided patient with information for GI office today so that he may reach out to schedule follow-up If he does have any issues with scheduling, advised him to let 32 know so that we may assist with scheduling for patient Not currently symptomatic at this time        Other   Right ankle pain    Uncertain etiology, exam benign in office today Recommend continuing with conservative measures.  Given general improvement, would expect improvement to continue.  If any worsening does occur or does not continue improving as expected, recommend returning to the office for further evaluation      Other Visit Diagnoses     Wellness examination       Relevant Orders   CBC with Differential/Platelet   Comprehensive metabolic panel   Hemoglobin A1c   Lipid panel   TSH Rfx on Abnormal to Free T4       Return in about 6 weeks (around 07/07/2022) for CPE with [facility] a few days prior.   Nadea Kirkland J De 07/09/2022, MD

## 2022-05-26 NOTE — Assessment & Plan Note (Signed)
Patient overdue for follow-up with GI provider as well as imaging.  Discussed recommendation to schedule follow-up with gastroenterologist, provided patient with information for GI office today so that he may reach out to schedule follow-up If he does have any issues with scheduling, advised him to let us know so that we may assist with scheduling for patient Not currently symptomatic at this time

## 2022-05-26 NOTE — Assessment & Plan Note (Signed)
Uncertain etiology, exam benign in office today Recommend continuing with conservative measures.  Given general improvement, would expect improvement to continue.  If any worsening does occur or does not continue improving as expected, recommend returning to the office for further evaluation

## 2022-07-14 ENCOUNTER — Ambulatory Visit (HOSPITAL_BASED_OUTPATIENT_CLINIC_OR_DEPARTMENT_OTHER): Payer: BC Managed Care – PPO

## 2022-07-14 ENCOUNTER — Encounter (HOSPITAL_BASED_OUTPATIENT_CLINIC_OR_DEPARTMENT_OTHER): Payer: Self-pay

## 2022-07-14 DIAGNOSIS — Z0000 Encounter for general adult medical examination without abnormal findings: Secondary | ICD-10-CM | POA: Diagnosis not present

## 2022-07-15 LAB — HEMOGLOBIN A1C
Est. average glucose Bld gHb Est-mCnc: 103 mg/dL
Hgb A1c MFr Bld: 5.2 % (ref 4.8–5.6)

## 2022-07-15 LAB — CBC WITH DIFFERENTIAL/PLATELET
Basophils Absolute: 0.1 10*3/uL (ref 0.0–0.2)
Basos: 1 %
EOS (ABSOLUTE): 0.1 10*3/uL (ref 0.0–0.4)
Eos: 2 %
Hematocrit: 42.8 % (ref 37.5–51.0)
Hemoglobin: 14.1 g/dL (ref 13.0–17.7)
Immature Grans (Abs): 0 10*3/uL (ref 0.0–0.1)
Immature Granulocytes: 0 %
Lymphocytes Absolute: 1.5 10*3/uL (ref 0.7–3.1)
Lymphs: 24 %
MCH: 29.6 pg (ref 26.6–33.0)
MCHC: 32.9 g/dL (ref 31.5–35.7)
MCV: 90 fL (ref 79–97)
Monocytes Absolute: 0.3 10*3/uL (ref 0.1–0.9)
Monocytes: 5 %
Neutrophils Absolute: 4.3 10*3/uL (ref 1.4–7.0)
Neutrophils: 68 %
Platelets: 243 10*3/uL (ref 150–450)
RBC: 4.76 x10E6/uL (ref 4.14–5.80)
RDW: 11.9 % (ref 11.6–15.4)
WBC: 6.3 10*3/uL (ref 3.4–10.8)

## 2022-07-15 LAB — COMPREHENSIVE METABOLIC PANEL
ALT: 21 IU/L (ref 0–44)
AST: 29 IU/L (ref 0–40)
Albumin/Globulin Ratio: 2 (ref 1.2–2.2)
Albumin: 4.8 g/dL (ref 4.1–5.1)
Alkaline Phosphatase: 80 IU/L (ref 44–121)
BUN/Creatinine Ratio: 13 (ref 9–20)
BUN: 14 mg/dL (ref 6–20)
Bilirubin Total: 0.7 mg/dL (ref 0.0–1.2)
CO2: 24 mmol/L (ref 20–29)
Calcium: 9.7 mg/dL (ref 8.7–10.2)
Chloride: 104 mmol/L (ref 96–106)
Creatinine, Ser: 1.09 mg/dL (ref 0.76–1.27)
Globulin, Total: 2.4 g/dL (ref 1.5–4.5)
Glucose: 110 mg/dL — ABNORMAL HIGH (ref 70–99)
Potassium: 5.3 mmol/L — ABNORMAL HIGH (ref 3.5–5.2)
Sodium: 141 mmol/L (ref 134–144)
Total Protein: 7.2 g/dL (ref 6.0–8.5)
eGFR: 91 mL/min/{1.73_m2} (ref >59)

## 2022-07-15 LAB — LIPID PANEL
Chol/HDL Ratio: 3 ratio (ref 0.0–5.0)
Cholesterol, Total: 142 mg/dL (ref 100–199)
HDL: 47 mg/dL (ref >39)
LDL Chol Calc (NIH): 77 mg/dL (ref 0–99)
Triglycerides: 93 mg/dL (ref 0–149)
VLDL Cholesterol Cal: 18 mg/dL (ref 5–40)

## 2022-07-15 LAB — TSH RFX ON ABNORMAL TO FREE T4: TSH: 2.5 u[IU]/mL (ref 0.450–4.500)

## 2022-07-25 ENCOUNTER — Ambulatory Visit (INDEPENDENT_AMBULATORY_CARE_PROVIDER_SITE_OTHER): Payer: BC Managed Care – PPO | Admitting: Family Medicine

## 2022-07-25 ENCOUNTER — Encounter (HOSPITAL_BASED_OUTPATIENT_CLINIC_OR_DEPARTMENT_OTHER): Payer: Self-pay | Admitting: Family Medicine

## 2022-07-25 ENCOUNTER — Other Ambulatory Visit (HOSPITAL_BASED_OUTPATIENT_CLINIC_OR_DEPARTMENT_OTHER): Payer: Self-pay

## 2022-07-25 VITALS — BP 132/77 | HR 69 | Temp 97.7°F | Ht 71.0 in | Wt 275.7 lb

## 2022-07-25 DIAGNOSIS — K851 Biliary acute pancreatitis without necrosis or infection: Secondary | ICD-10-CM

## 2022-07-25 DIAGNOSIS — Z23 Encounter for immunization: Secondary | ICD-10-CM | POA: Diagnosis not present

## 2022-07-25 DIAGNOSIS — Z0000 Encounter for general adult medical examination without abnormal findings: Secondary | ICD-10-CM

## 2022-07-25 NOTE — Patient Instructions (Signed)
  Medication Instructions:  Your physician recommends that you continue on your current medications as directed. Please refer to the Current Medication list given to you today. --If you need a refill on any your medications before your next appointment, please call your pharmacy first. If no refills are authorized on file call the office.-- Lab Work: Your physician has recommended that you have lab work today: No If you have labs (blood work) drawn today and your tests are completely normal, you will receive your results via MyChart message OR a phone call from our staff.  Please ensure you check your voicemail in the event that you authorized detailed messages to be left on a delegated number. If you have any lab test that is abnormal or we need to change your treatment, we will call you to review the results.  Referrals/Procedures/Imaging: No  Follow-Up: Your next appointment:   Your physician recommends that you schedule a follow-up appointment in: 1 year cpe with Dr. de [place].  You will receive a text message or e-mail with a link to a survey about your care and experience with us today! We would greatly appreciate your feedback!   Thanks for letting us be apart of your health journey!!  Primary Care and Sports Medicine   Dr. Raymond de [place]   We encourage you to activate your patient portal called "MyChart".  Sign up information is provided on this After Visit Summary.  MyChart is used to connect with patients for Virtual Visits (Telemedicine).  Patients are able to view lab/test results, encounter notes, upcoming appointments, etc.  Non-urgent messages can be sent to your provider as well. To learn more about what you can do with MyChart, please visit --  [email or web address].    

## 2022-07-25 NOTE — Progress Notes (Unsigned)
Amb

## 2022-07-25 NOTE — Progress Notes (Signed)
Subjective:    CC: Annual Physical Exam  HPI:  Benjamin Gregory is a 36 y.o. presenting for annual physical  I reviewed the past medical history, family history, social history, surgical history, and allergies today and no changes were needed.  Please see the problem list section below in epic for further details.  Past Medical History: Past Medical History:  Diagnosis Date   Gallstones    Pseudocyst of pancreas    Past Surgical History: Past Surgical History:  Procedure Laterality Date   CHOLECYSTECTOMY N/A 03/29/2019   Procedure: LAPAROSCOPIC CHOLECYSTECTOMY WITH INTRAOPERATIVE CHOLANGIOGRAM;  Surgeon: Ralene Ok, MD;  Location: [facility];  Service: General;  Laterality: N/A;   Social History: Social History   Socioeconomic History   Marital status: Single    Spouse name: Not on file   Number of children: 0   Years of education: Not on file   Highest education level: Not on file  Occupational History   Occupation: Passenger transport manager  Tobacco Use   Smoking status: Never   Smokeless tobacco: Never  Vaping Use   Vaping Use: Never used  Substance and Sexual Activity   Alcohol use: Yes    Alcohol/week: 4.0 standard drinks of alcohol    Types: 4 Standard drinks or equivalent per week   Drug use: Never   Sexual activity: Not on file  Other Topics Concern   Not on file  Social History Narrative   Not on file   Social Determinants of Health   Financial Resource Strain: Not on file  Food Insecurity: Not on file  Transportation Needs: Not on file  Physical Activity: Not on file  Stress: Not on file  Social Connections: Not on file   Family History: Family History  Problem Relation Age of Onset   Hypertension Father    Colon cancer Neg Hx    Esophageal cancer Neg Hx    Inflammatory bowel disease Neg Hx    Liver disease Neg Hx    Pancreatic cancer Neg Hx    Rectal cancer Neg Hx    Stomach cancer Neg Hx    Allergies: No Known Allergies Medications: See med  rec.  Review of Systems: No headache, visual changes, nausea, vomiting, diarrhea, constipation, dizziness, abdominal pain, skin rash, fevers, chills, night sweats, swollen lymph nodes, weight loss, chest pain, body aches, joint swelling, muscle aches, shortness of breath, mood changes, visual or auditory hallucinations.  Objective:    BP 132/77   Pulse 69   Temp 97.7 F (36.5 C) (Oral)   Ht 5\' 11"  (1.803 m)   Wt 275 lb 11.2 oz (125.1 kg)   SpO2 99%   BMI 38.45 kg/m   General: Well Developed, well nourished, and in no acute distress. Neuro: Alert and oriented x3, extra-ocular muscles intact, sensation grossly intact. Cranial nerves II through XII are intact, motor, sensory, and coordinative functions are all intact. HEENT: Normocephalic, atraumatic, pupils equal round reactive to light, neck supple, no masses, no lymphadenopathy, thyroid nonpalpable. Oropharynx, nasopharynx, external ear canals are unremarkable. Skin: Warm and dry, no rashes noted. Cardiac: Regular rate and rhythm, no murmurs rubs or gallops. Respiratory: Clear to auscultation bilaterally. Not using accessory muscles, speaking in full sentences. Abdominal: Soft, nontender, nondistended, positive bowel sounds, no masses, no organomegaly. Musculoskeletal: Shoulder, elbow, wrist, hip, knee, ankle stable, and with full range of motion.  Impression and Recommendations:    Wellness examination Routine HCM labs reviewed. HCM reviewed/discussed. Anticipatory guidance regarding healthy weight, lifestyle and choices given. Recommend  healthy diet.  Recommend approximately 150 minutes/week of moderate intensity exercise Recommend regular dental and vision exams Always use seatbelt/lap and shoulder restraints Recommend using smoke alarms and checking batteries at least twice a year Recommend using sunscreen when outside Discussed tetanus immunization recommendations, patient agreed to proceed with this today Recommend seasonal  flu vaccine, patient amenable, administered today  Return in about 1 year (around 07/26/2023) for CPE.   ___________________________________________ Bethann Qualley de [place], MD, ABFM, CAQSM Primary Care and Sports Medicine [facility]

## 2022-07-25 NOTE — Assessment & Plan Note (Addendum)
Routine HCM labs reviewed. HCM reviewed/discussed. Anticipatory guidance regarding healthy weight, lifestyle and choices given. Recommend healthy diet.  Recommend approximately 150 minutes/week of moderate intensity exercise Recommend regular dental and vision exams Always use seatbelt/lap and shoulder restraints Recommend using smoke alarms and checking batteries at least twice a year Recommend using sunscreen when outside Discussed tetanus immunization recommendations, patient agreed to proceed with this today Recommend seasonal flu vaccine, patient amenable, administered today

## 2023-07-29 ENCOUNTER — Ambulatory Visit (HOSPITAL_BASED_OUTPATIENT_CLINIC_OR_DEPARTMENT_OTHER): Payer: BC Managed Care – PPO | Admitting: Family Medicine

## 2023-07-29 ENCOUNTER — Encounter (HOSPITAL_BASED_OUTPATIENT_CLINIC_OR_DEPARTMENT_OTHER): Payer: Self-pay | Admitting: Family Medicine

## 2023-07-29 VITALS — BP 128/84 | HR 62 | Ht 71.0 in | Wt 268.0 lb

## 2023-07-29 DIAGNOSIS — Z23 Encounter for immunization: Secondary | ICD-10-CM

## 2023-07-29 DIAGNOSIS — Z0000 Encounter for general adult medical examination without abnormal findings: Secondary | ICD-10-CM

## 2023-07-29 NOTE — Progress Notes (Signed)
Subjective:    CC: Annual Physical Exam  HPI:  Benjamin Gregory is a 37 y.o. presenting for annual physical  I reviewed the past medical history, family history, social history, surgical history, and allergies today and no changes were needed.  Please see the problem list section below in epic for further details.  Past Medical History: Past Medical History:  Diagnosis Date   Gallstones    Pseudocyst of pancreas    Past Surgical History: Past Surgical History:  Procedure Laterality Date   CHOLECYSTECTOMY N/A 03/29/2019   Procedure: LAPAROSCOPIC CHOLECYSTECTOMY WITH INTRAOPERATIVE CHOLANGIOGRAM;  Surgeon: Axel Filler, MD;  Location: [facility];  Service: General;  Laterality: N/A;   Social History: Social History   Socioeconomic History   Marital status: Single    Spouse name: Not on file   Number of children: 0   Years of education: Not on file   Highest education level: Not on file  Occupational History   Occupation: Scientist, water quality  Tobacco Use   Smoking status: Never   Smokeless tobacco: Never  Vaping Use   Vaping status: Never Used  Substance and Sexual Activity   Alcohol use: Yes    Alcohol/week: 4.0 standard drinks of alcohol    Types: 4 Standard drinks or equivalent per week   Drug use: Never   Sexual activity: Not on file  Other Topics Concern   Not on file  Social History Narrative   Not on file   Social Determinants of Health   Financial Resource Strain: Low Risk  (07/29/2023)   Overall Financial Resource Strain (CARDIA)    Difficulty of Paying Living Expenses: Not hard at all  Food Insecurity: No Food Insecurity (07/29/2023)   Hunger Vital Sign    Worried About Running Out of Food in the Last Year: Never true    Ran Out of Food in the Last Year: Never true  Transportation Needs: No Transportation Needs (07/29/2023)   PRAPARE - Administrator, Civil Service (Medical): No    Lack of Transportation (Non-Medical): No  Physical Activity:  Sufficiently Active (07/29/2023)   Exercise Vital Sign    Days of Exercise per Week: 3 days    Minutes of Exercise per Session: 60 min  Stress: No Stress Concern Present (07/29/2023)   [employer] of Occupational Health - Occupational Stress Questionnaire    Feeling of Stress : Not at all  Social Connections: Socially Isolated (07/29/2023)   Social Connection and Isolation Panel [NHANES]    Frequency of Communication with Friends and Family: Once a week    Frequency of Social Gatherings with Friends and Family: Once a week    Attends Religious Services: Never    Database administrator or Organizations: No    Attends Engineer, structural: Never    Marital Status: Married   Family History: Family History  Problem Relation Age of Onset   Hypertension Father    Colon cancer Neg Hx    Esophageal cancer Neg Hx    Inflammatory bowel disease Neg Hx    Liver disease Neg Hx    Pancreatic cancer Neg Hx    Rectal cancer Neg Hx    Stomach cancer Neg Hx    Allergies: No Known Allergies Medications: See med rec.  Review of Systems: No headache, visual changes, nausea, vomiting, diarrhea, constipation, dizziness, abdominal pain, skin rash, fevers, chills, night sweats, swollen lymph nodes, weight loss, chest pain, body aches, joint swelling, muscle aches, shortness of breath, mood  changes, visual or auditory hallucinations.  Objective:    BP 128/84 (BP Location: Right Arm, Patient Position: Sitting, Cuff Size: Normal)   Pulse 62   Ht 5\' 11"  (1.803 m)   Wt 268 lb (121.6 kg)   SpO2 98%   BMI 37.38 kg/m   General: Well Developed, well nourished, and in no acute distress. Neuro: Alert and oriented x3, extra-ocular muscles intact, sensation grossly intact. Cranial nerves II through XII are intact, motor, sensory, and coordinative functions are all intact. HEENT: Normocephalic, atraumatic, pupils equal round reactive to light, neck supple, no masses, no lymphadenopathy, thyroid  nonpalpable. Oropharynx, nasopharynx, external ear canals are unremarkable. Skin: Warm and dry, no rashes noted. Cardiac: Regular rate and rhythm, no murmurs rubs or gallops. Respiratory: Clear to auscultation bilaterally. Not using accessory muscles, speaking in full sentences. Abdominal: Soft, nontender, nondistended, positive bowel sounds, no masses, no organomegaly. Musculoskeletal: Shoulder, elbow, wrist, hip, knee, ankle stable, and with full range of motion.  Impression and Recommendations:    Wellness examination Assessment & Plan: Routine HCM labs ordered. HCM reviewed/discussed. Anticipatory guidance regarding healthy weight, lifestyle and choices given. Recommend healthy diet.  Recommend approximately 150 minutes/week of moderate intensity exercise Recommend regular dental and vision exams Always use seatbelt/lap and shoulder restraints Recommend using smoke alarms and checking batteries at least twice a year Recommend using sunscreen when outside Discussed tetanus immunization recommendations, patient is UTD Recommend seasonal flu vaccine, patient amenable, administered today  Orders: -     Comprehensive metabolic panel; Future -     CBC with Differential/Platelet; Future -     Hemoglobin A1c; Future -     Lipid panel; Future -     TSH Rfx on Abnormal to Free T4; Future  Return in about 1 year (around 07/28/2024) for CPE. Patient will be getting married next weekend. Wedding at [facility].   ___________________________________________ Millette Halberstam de [place], MD, ABFM, [facility] Primary Care and Sports Medicine [facility]

## 2023-07-29 NOTE — Assessment & Plan Note (Signed)
Routine HCM labs ordered. HCM reviewed/discussed. Anticipatory guidance regarding healthy weight, lifestyle and choices given. Recommend healthy diet.  Recommend approximately 150 minutes/week of moderate intensity exercise Recommend regular dental and vision exams Always use seatbelt/lap and shoulder restraints Recommend using smoke alarms and checking batteries at least twice a year Recommend using sunscreen when outside Discussed tetanus immunization recommendations, patient is UTD Recommend seasonal flu vaccine, patient amenable, administered today

## 2023-08-06 ENCOUNTER — Other Ambulatory Visit (HOSPITAL_BASED_OUTPATIENT_CLINIC_OR_DEPARTMENT_OTHER): Payer: Self-pay | Admitting: Family Medicine

## 2023-08-06 DIAGNOSIS — Z0000 Encounter for general adult medical examination without abnormal findings: Secondary | ICD-10-CM | POA: Diagnosis not present

## 2023-08-07 LAB — CBC WITH DIFFERENTIAL/PLATELET
Basophils Absolute: 0.1 10*3/uL (ref 0.0–0.2)
Basos: 1 %
EOS (ABSOLUTE): 0.2 10*3/uL (ref 0.0–0.4)
Eos: 2 %
Hematocrit: 41.8 % (ref 37.5–51.0)
Hemoglobin: 14.1 g/dL (ref 13.0–17.7)
Immature Grans (Abs): 0 10*3/uL (ref 0.0–0.1)
Immature Granulocytes: 0 %
Lymphocytes Absolute: 1.8 10*3/uL (ref 0.7–3.1)
Lymphs: 22 %
MCH: 31 pg (ref 26.6–33.0)
MCHC: 33.7 g/dL (ref 31.5–35.7)
MCV: 92 fL (ref 79–97)
Monocytes Absolute: 0.4 10*3/uL (ref 0.1–0.9)
Monocytes: 5 %
Neutrophils Absolute: 5.6 10*3/uL (ref 1.4–7.0)
Neutrophils: 70 %
Platelets: 254 10*3/uL (ref 150–450)
RBC: 4.55 x10E6/uL (ref 4.14–5.80)
RDW: 11.5 % — ABNORMAL LOW (ref 11.6–15.4)
WBC: 8 10*3/uL (ref 3.4–10.8)

## 2023-08-07 LAB — COMPREHENSIVE METABOLIC PANEL
ALT: 19 [IU]/L (ref 0–44)
AST: 20 [IU]/L (ref 0–40)
Albumin: 4.5 g/dL (ref 4.1–5.1)
Alkaline Phosphatase: 76 [IU]/L (ref 44–121)
BUN/Creatinine Ratio: 11 (ref 9–20)
BUN: 12 mg/dL (ref 6–20)
Bilirubin Total: 0.5 mg/dL (ref 0.0–1.2)
CO2: 25 mmol/L (ref 20–29)
Calcium: 9.6 mg/dL (ref 8.7–10.2)
Chloride: 102 mmol/L (ref 96–106)
Creatinine, Ser: 1.1 mg/dL (ref 0.76–1.27)
Globulin, Total: 2.7 g/dL (ref 1.5–4.5)
Glucose: 103 mg/dL — ABNORMAL HIGH (ref 70–99)
Potassium: 4.4 mmol/L (ref 3.5–5.2)
Sodium: 142 mmol/L (ref 134–144)
Total Protein: 7.2 g/dL (ref 6.0–8.5)
eGFR: 90 mL/min/{1.73_m2} (ref >59)

## 2023-08-07 LAB — LIPID PANEL
Chol/HDL Ratio: 3.2 {ratio} (ref 0.0–5.0)
Cholesterol, Total: 146 mg/dL (ref 100–199)
HDL: 46 mg/dL (ref >39)
LDL Chol Calc (NIH): 70 mg/dL (ref 0–99)
Triglycerides: 180 mg/dL — ABNORMAL HIGH (ref 0–149)
VLDL Cholesterol Cal: 30 mg/dL (ref 5–40)

## 2023-08-07 LAB — TSH RFX ON ABNORMAL TO FREE T4: TSH: 2.01 u[IU]/mL (ref 0.450–4.500)

## 2023-08-07 LAB — HEMOGLOBIN A1C
Est. average glucose Bld gHb Est-mCnc: 105 mg/dL
Hgb A1c MFr Bld: 5.3 % (ref 4.8–5.6)

## 2024-02-22 ENCOUNTER — Telehealth: Admitting: Physician Assistant

## 2024-02-22 DIAGNOSIS — R55 Syncope and collapse: Secondary | ICD-10-CM

## 2024-02-22 DIAGNOSIS — A084 Viral intestinal infection, unspecified: Secondary | ICD-10-CM

## 2024-02-22 MED ORDER — ONDANSETRON 4 MG PO TBDP: 4.0000 mg | ORAL_TABLET | Freq: Three times a day (TID) | ORAL | 0 refills | Status: DC | PRN

## 2024-02-22 NOTE — Patient Instructions (Signed)
 Asa Bjork, thank you for joining Angelia Kelp, PA-C for today's virtual visit.  While this provider is not your primary care provider (PCP), if your PCP is located in our provider database this encounter information will be shared with them immediately following your visit.   A [facility] MyChart account gives you access to today's visit and all your visits, tests, and labs performed at [facility] " click here if you don't have a [facility] MyChart account or go to [email or web address]  Consent: (Patient) Asa Bjork provided verbal consent for this virtual visit at the beginning of the encounter.  Current Medications:  Current Outpatient Medications:    MULTIPLE VITAMIN PO, 2 gummies daily, Disp: , Rfl:    ondansetron  (ZOFRAN -ODT) 4 MG disintegrating tablet, Take 1-2 tablets (4-8 mg total) by mouth every 8 (eight) hours as needed., Disp: 20 tablet, Rfl: 0   Medications ordered in this encounter:  Meds ordered this encounter  Medications   ondansetron  (ZOFRAN -ODT) 4 MG disintegrating tablet    Sig: Take 1-2 tablets (4-8 mg total) by mouth every 8 (eight) hours as needed.    Dispense:  20 tablet    Refill:  0    Supervising Provider:   LAMPTEY, PHILIP O [[id number]]     *If you need refills on other medications prior to your next appointment, please contact your pharmacy*  Follow-Up: Call back or seek an in-person evaluation if the symptoms worsen or if the condition fails to improve as anticipated.  [facility] [phone]  Other Instructions  Syncope, Adult  Syncope refers to a condition in which a person temporarily loses consciousness. Syncope may also be called fainting or passing out. It is caused by a sudden decrease in blood flow to the brain. This can happen for a variety of reasons. Most causes of syncope are not dangerous. It can be triggered by things such as needle sticks, seeing blood, pain, or intense emotion. However,  syncope can also be a sign of a serious medical problem, such as a heart abnormality. Other causes can include dehydration, migraines, or taking medicines that lower blood pressure. Your health care provider may do tests to find the reason why you are having syncope. If you faint, get medical help right away. Call your local emergency services (911 in the [place] Follow these instructions at home: Pay attention to any changes in your symptoms. Take these actions to stay safe and to help relieve your symptoms: Knowing when you may be about to faint Signs that you may be about to faint include: Feeling dizzy, weak, light-headed, or like the room is spinning. Feeling nauseous. Seeing spots or seeing all white or all black in your field of vision. Having cold, clammy skin or feeling warm and sweaty. Hearing ringing in the ears (tinnitus). If you start to feel like you might faint, sit or lie down right away. If sitting, put your head down between your legs. If lying down, raise (elevate) your feet above the level of your heart. Breathe deeply and steadily. Wait until all the symptoms have passed. Have someone stay with you until you feel stable. Medicines Take over-the-counter and prescription medicines only as told by your health care provider. If you are taking blood pressure or heart medicine, get up slowly and take several minutes to sit and then stand. This can reduce dizziness and decrease the risk of syncope. Lifestyle Do not drive, use machinery, or play sports until your health care provider says  it is okay. Do not drink alcohol. Do not use any products that contain nicotine or tobacco. These products include cigarettes, chewing tobacco, and vaping devices, such as e-cigarettes. If you need help quitting, ask your health care provider. Avoid hot tubs and saunas. General instructions Talk with your health care provider about your symptoms. You may need to have testing to understand the  cause of your syncope. Drink enough fluid to keep your urine pale yellow. Avoid prolonged standing. If you must stand for a long time, do movements such as: Moving your legs. Crossing your legs. Flexing and stretching your leg muscles. Squatting. Keep all follow-up visits. This is important. Contact a health care provider if: You have episodes of near fainting. Get help right away if: You faint. You hit your head or are injured after fainting. You have any of these symptoms that may indicate trouble with your heart: Fast or irregular heartbeats (palpitations). Unusual pain in your chest, abdomen, or back. Shortness of breath. You have a seizure. You have a severe headache. You are confused. You have vision problems. You have severe weakness or trouble walking. You are bleeding from your mouth or rectum, or you have black or tarry stool. These symptoms may represent a serious problem that is an emergency. Do not wait to see if your symptoms will go away. Get medical help right away. Call your local emergency services (911 in the [place] Do not drive yourself to the hospital. Summary Syncope refers to a condition in which a person temporarily loses consciousness. Syncope may also be called fainting or passing out. It is caused by a sudden decrease in blood flow to the brain. Signs that you may be about to faint include dizziness, feeling light-headed, feeling nauseous, sudden vision changes, or cold, clammy skin. Even though most causes of syncope are not dangerous, syncope can be a sign of a serious medical problem. Get help right away if you faint. If you start to feel like you might faint, sit or lie down right away. If sitting, put your head down between your legs. If lying down, raise (elevate) your feet above the level of your heart. This information is not intended to replace advice given to you by your health care provider. Make sure you discuss any questions you have with your  health care provider. Document Revised: 02/14/2021 Document Reviewed: 02/14/2021 Elsevier Patient Education  2024 Elsevier Inc.   Viral Gastroenteritis, Adult  Viral gastroenteritis is also known as the stomach flu. This condition may affect your stomach, small intestine, and large intestine. It can cause sudden watery diarrhea, fever, and vomiting. This condition is caused by many different viruses. These viruses can be passed from person to person very easily (are contagious). Diarrhea and vomiting can make you feel weak and cause you to become dehydrated. You may not be able to keep fluids down. Dehydration can make you tired and thirsty, cause you to have a dry mouth, and decrease how often you urinate. It is important to replace the fluids that you lose from diarrhea and vomiting. What are the causes? Gastroenteritis is caused by many viruses, including rotavirus and norovirus. Norovirus is the most common cause in adults. You can get sick after being exposed to the viruses from other people. You can also get sick by: Eating food, drinking water, or touching a surface contaminated with one of these viruses. Sharing utensils or other personal items with an infected person. What increases the risk? You are more likely to  develop this condition if you: Have a weak body defense system (immune system). Live with one or more children who are younger than 2 years. Live in a nursing home. Travel on cruise ships. What are the signs or symptoms? Symptoms of this condition start suddenly 1-3 days after exposure to a virus. Symptoms may last for a few days or for as long as a week. Common symptoms include watery diarrhea and vomiting. Other symptoms include: Fever. Headache. Fatigue. Pain in the abdomen. Chills. Weakness. Nausea. Muscle aches. Loss of appetite. How is this diagnosed? This condition is diagnosed with a medical history and physical exam. You may also have a stool test to check  for viruses or other infections. How is this treated? This condition typically goes away on its own. The focus of treatment is to prevent dehydration and restore lost fluids (rehydration). This condition may be treated with: An oral rehydration solution (ORS) to replace important salts and minerals (electrolytes) in your body. Take this if told by your health care provider. This is a drink that is sold at pharmacies and retail stores. Medicines to help with your symptoms. Probiotic supplements to reduce symptoms of diarrhea. Fluids given through an IV, if dehydration is severe. Older adults and people with other diseases or a weak immune system are at higher risk for dehydration. Follow these instructions at home: Eating and drinking  Take an ORS as told by your health care provider. Drink clear fluids in small amounts as you are able. Clear fluids include: Water. Ice chips. Diluted fruit juice. Low-calorie sports drinks. Drink enough fluid to keep your urine pale yellow. Eat small amounts of healthy foods every 3-4 hours as you are able. This may include whole grains, fruits, vegetables, lean meats, and yogurt. Avoid fluids that contain a lot of sugar or caffeine, such as energy drinks, sports drinks, and soda. Avoid spicy or fatty foods. Avoid alcohol. General instructions  Wash your hands often, especially after having diarrhea or vomiting. If soap and water are not available, use hand sanitizer. Make sure that all people in your household wash their hands well and often. Take over-the-counter and prescription medicines only as told by your health care provider. Rest at home while you recover. Watch your condition for any changes. Take a warm bath to relieve any burning or pain from frequent diarrhea episodes. Keep all follow-up visits. This is important. Contact a health care provider if you: Cannot keep fluids down. Have symptoms that get worse. Have new symptoms. Feel  light-headed or dizzy. Have muscle cramps. Get help right away if you: Have chest pain. Have trouble breathing or you are breathing very quickly. Have a fast heartbeat. Feel extremely weak or you faint. Have a severe headache, a stiff neck, or both. Have a rash. Have severe pain, cramping, or bloating in your abdomen. Have skin that feels cold and clammy. Feel confused. Have pain when you urinate. Have signs of dehydration, such as: Dark urine, very little urine, or no urine. Cracked lips. Dry mouth. Sunken eyes. Sleepiness. Weakness. Have signs of bleeding, such as: Seeing blood in your vomit. Having vomit that looks like coffee grounds. Having bloody or black stools or stools that look like tar. These symptoms may be an emergency. Get help right away. Call 911. Do not wait to see if the symptoms will go away. Do not drive yourself to the hospital. Summary Viral gastroenteritis is also known as the stomach flu. It can cause sudden watery diarrhea, fever, and  vomiting. This condition can be passed from person to person very easily (is contagious). Take an oral rehydration solution (ORS) if told by your health care provider. This is a drink that is sold at pharmacies and retail stores. Wash your hands often, especially after having diarrhea or vomiting. If soap and water are not available, use hand sanitizer. This information is not intended to replace advice given to you by your health care provider. Make sure you discuss any questions you have with your health care provider. Document Revised: 08/05/2021 Document Reviewed: 08/05/2021 Elsevier Patient Education  2024 Elsevier Inc.   If you have been instructed to have an in-person evaluation today at a local Urgent Care facility, please use the link below. It will take you to a list of all of our available [facility], including address, phone number and hours of operation. Please do not delay care.  [facility]  If you or a family member do not have a primary care provider, use the link below to schedule a visit and establish care. When you choose a [facility] primary care physician or advanced practice provider, you gain a long-term partner in health. Find a Primary Care Provider  Learn more about [facility]'s in-office and virtual care options: [facility] - Get Care Now

## 2024-02-22 NOTE — Progress Notes (Signed)
 Virtual Visit Consent   Benjamin Gregory, you are scheduled for a virtual visit with a [facility] provider today. Just as with appointments in the office, your consent must be obtained to participate. Your consent will be active for this visit and any virtual visit you may have with one of our providers in the next 365 days. If you have a MyChart account, a copy of this consent can be sent to you electronically.  As this is a virtual visit, video technology does not allow for your provider to perform a traditional examination. This may limit your provider's ability to fully assess your condition. If your provider identifies any concerns that need to be evaluated in person or the need to arrange testing (such as labs, EKG, etc.), we will make arrangements to do so. Although advances in technology are sophisticated, we cannot ensure that it will always work on either your end or our end. If the connection with a video visit is poor, the visit may have to be switched to a telephone visit. With either a video or telephone visit, we are not always able to ensure that we have a secure connection.  By engaging in this virtual visit, you consent to the provision of healthcare and authorize for your insurance to be billed (if applicable) for the services provided during this visit. Depending on your insurance coverage, you may receive a charge related to this service.  I need to obtain your verbal consent now. Are you willing to proceed with your visit today? Benjamin Gregory has provided verbal consent on 02/22/2024 for a virtual visit (video or telephone). Angelia Kelp, PA-C  Date: 02/22/2024 10:39 AM   Virtual Visit via Video Note   I, Angelia Kelp, connected with  Benjamin Gregory  ([id number], Aug 23, 1986) on 02/22/24 at 10:30 AM EDT by a video-enabled telemedicine application and verified that I am speaking with the correct person using two identifiers.  Location: Patient: Virtual Visit Location Patient:  Home Provider: Virtual Visit Location Provider: Home Office   I discussed the limitations of evaluation and management by telemedicine and the availability of in person appointments. The patient expressed understanding and agreed to proceed.    History of Present Illness: Benjamin Gregory is a 38 y.o. who identifies as a male who was assigned male at birth, and is being seen today for syncopal episode with vomting.  HPI: Loss of Consciousness This is a new problem. The current episode started yesterday (last night started with frequent emesis and nausea, went to the bathroom and had one episode of syncope while vomiting). The problem has been unchanged. He lost consciousness for a period of less than 1 minute. Exacerbated by: vomiting. Associated symptoms include abdominal pain, diaphoresis, dizziness, a fever (subjective), headaches, light-headedness, malaise/fatigue, nausea, vomiting and weakness. Pertinent negatives include no auditory change, aura, back pain, bladder incontinence, bowel incontinence, chest pain, clumsiness, confusion, focal sensory loss, focal weakness, palpitations, slurred speech, vertigo or visual change. He has tried nothing for the symptoms. The treatment provided no relief.      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   Abnormal MRI of abdomen 04/16/2019   Night sweats 04/16/2019   Generalized postprandial abdominal pain 04/16/2019  Leukocytosis 04/16/2019   Choledocholithiasis 03/26/2019   Pancreatitis, gallstone 03/26/2019   Encounter for screening for HIV 03/26/2019    Allergies: No Known Allergies Medications:  Current Outpatient Medications:    MULTIPLE VITAMIN PO, 2  gummies daily, Disp: , Rfl:    ondansetron  (ZOFRAN -ODT) 4 MG disintegrating tablet, Take 1-2 tablets (4-8 mg total) by mouth every 8 (eight) hours as needed., Disp: 20 tablet, Rfl: 0  Observations/Objective: Patient is well-developed, well-nourished in no acute distress.  Resting comfortably at home.  Head is normocephalic, atraumatic.  No labored breathing.  Speech is clear and coherent with logical content.  Patient is alert and oriented at baseline.    Assessment and Plan: 1. Viral gastroenteritis (Primary) - ondansetron  (ZOFRAN -ODT) 4 MG disintegrating tablet; Take 1-2 tablets (4-8 mg total) by mouth every 8 (eight) hours as needed.  Dispense: 20 tablet; Refill: 0  2. Vasovagal syncope - ondansetron  (ZOFRAN -ODT) 4 MG disintegrating tablet; Take 1-2 tablets (4-8 mg total) by mouth every 8 (eight) hours as needed.  Dispense: 20 tablet; Refill: 0  - Suspect viral gastroenteritis causing vasovagal syncope - Zofran  for nausea - Push fluids, electrolyte beverages - Liquid diet, then increase to soft/bland (BRAT) diet over next day, then increase diet as tolerated - Seek in person evaluation if not improving or symptoms worsen   Follow Up Instructions: I discussed the assessment and treatment plan with the patient. The patient was provided an opportunity to ask questions and all were answered. The patient agreed with the plan and demonstrated an understanding of the instructions.  A copy of instructions were sent to the patient via MyChart unless otherwise noted below.    The patient was advised to call back or seek an in-person evaluation if the symptoms worsen or if the condition fails to improve as anticipated.    Angelia Kelp, PA-C

## 2024-03-27 ENCOUNTER — Encounter: Payer: Self-pay | Admitting: Physician Assistant

## 2024-03-27 ENCOUNTER — Encounter (HOSPITAL_BASED_OUTPATIENT_CLINIC_OR_DEPARTMENT_OTHER): Payer: Self-pay | Admitting: Family Medicine

## 2024-03-28 ENCOUNTER — Encounter (HOSPITAL_BASED_OUTPATIENT_CLINIC_OR_DEPARTMENT_OTHER): Payer: Self-pay | Admitting: Family Medicine

## 2024-03-28 ENCOUNTER — Ambulatory Visit (INDEPENDENT_AMBULATORY_CARE_PROVIDER_SITE_OTHER): Admitting: Family Medicine

## 2024-03-28 VITALS — BP 106/72 | HR 80 | Ht 70.0 in | Wt 275.9 lb

## 2024-03-28 DIAGNOSIS — R110 Nausea: Secondary | ICD-10-CM | POA: Diagnosis not present

## 2024-03-28 DIAGNOSIS — K863 Pseudocyst of pancreas: Secondary | ICD-10-CM

## 2024-03-28 MED ORDER — ONDANSETRON 4 MG PO TBDP: 4.0000 mg | ORAL_TABLET | Freq: Three times a day (TID) | ORAL | 0 refills | Status: DC | PRN

## 2024-03-28 NOTE — Progress Notes (Signed)
    Procedures performed today:    None.  Independent interpretation of notes and tests performed by another provider:   None.  Brief History, Exam, Impression, and Recommendations:    BP 106/72 (BP Location: Right Arm, Patient Position: Sitting, Cuff Size: Large)   Pulse 80   Ht 5\' 10"  (1.778 m)   Wt 275 lb 14.4 oz (125.1 kg)   SpO2 95%   BMI 39.59 kg/m   Nausea Assessment & Plan: Diagnosed with gastroenteritis about 1 month ago (possible norovirus) with nausea and vomiting, improved after a few days. Unfortunately has continued to have intermittent symptoms of nausea, no vomiting. No diarrhea or constipation. No abdominal pain currently. After initial symptoms, no issues with fevers, chills or sweats. Not aware of specific triggers, has occurred with beer consumption, overeating, but also had some symptoms after eating oatmeal. On exam, patient is in no acute distress, vital signs stable.  Cardiovascular exam with regular rate and rhythm, lungs clear to auscultation bilaterally.  Abdomen is soft, nontender, nondistended, normal bowel sounds. Patient unfortunately has not had prior follow-up with GI as recommended. Last appointment with GI was 2021 with recommended follow-up in 2022. Given current symptoms and patient history, recommend follow-up with GI as it is overdue at this time.  Current symptoms could be related to prior issue related to pancreatic pseudocyst.  Can proceed with labs today given ongoing symptoms.  Orders: -     CBC with Differential/Platelet -     Comprehensive metabolic panel with GFR -     Lipase -     Ondansetron ; Take 1-2 tablets (4-8 mg total) by mouth every 8 (eight) hours as needed.  Dispense: 20 tablet; Refill: 0 -     Ambulatory referral to Gastroenterology  Pancreatic pseudocyst Assessment & Plan: Patient overdue for follow-up with GI provider as well as imaging.  Discussed recommendation to schedule follow-up with gastroenterologist, provided  patient with information for GI office today and referral placed given amount of time since last appointment GI.  Orders: -     Lipase -     Ambulatory referral to Gastroenterology  Return if symptoms worsen or fail to improve.   ___________________________________________ Chon Buhl de [place], MD, ABFM, CAQSM Primary Care and Sports Medicine [facility]

## 2024-03-28 NOTE — Patient Instructions (Signed)
  Medication Instructions:  Your physician recommends that you continue on your current medications as directed. Please refer to the Current Medication list given to you today. --If you need a refill on any your medications before your next appointment, please call your pharmacy first. If no refills are authorized on file call the office.-- Lab Work: Your physician has recommended that you have lab work today: today If you have labs (blood work) drawn today and your tests are completely normal, you will receive your results via MyChart message OR a phone call from our staff.  Please ensure you check your voicemail in the event that you authorized detailed messages to be left on a delegated number. If you have any lab test that is abnormal or we need to change your treatment, we will call you to review the results.   Follow-Up: Your next appointment:   Your physician recommends that you schedule a follow-up appointment in: as needed with Dr. de Peru  You will receive a text message or e-mail with a link to a survey about your care and experience with [place] today! We would greatly appreciate your feedback!   Thanks for letting us be apart of your health journey!!  Primary Care and Sports Medicine   Dr. Ceasar Mons Peru   We encourage you to activate your patient portal called "MyChart".  Sign up information is provided on this After Visit Summary.  MyChart is used to connect with patients for Virtual Visits (Telemedicine).  Patients are able to view lab/test results, encounter notes, upcoming appointments, etc.  Non-urgent messages can be sent to your provider as well. To learn more about what you can do with MyChart, please visit --  [email or web address].

## 2024-03-28 NOTE — Assessment & Plan Note (Signed)
 Diagnosed with gastroenteritis about 1 month ago (possible norovirus) with nausea and vomiting, improved after a few days. Unfortunately has continued to have intermittent symptoms of nausea, no vomiting. No diarrhea or constipation. No abdominal pain currently. After initial symptoms, no issues with fevers, chills or sweats. Not aware of specific triggers, has occurred with beer consumption, overeating, but also had some symptoms after eating oatmeal. On exam, patient is in no acute distress, vital signs stable.  Cardiovascular exam with regular rate and rhythm, lungs clear to auscultation bilaterally.  Abdomen is soft, nontender, nondistended, normal bowel sounds. Patient unfortunately has not had prior follow-up with GI as recommended. Last appointment with GI was 2021 with recommended follow-up in 2022. Given current symptoms and patient history, recommend follow-up with GI as it is overdue at this time.  Current symptoms could be related to prior issue related to pancreatic pseudocyst.  Can proceed with labs today given ongoing symptoms.

## 2024-03-28 NOTE — Assessment & Plan Note (Signed)
 Patient overdue for follow-up with GI provider as well as imaging.  Discussed recommendation to schedule follow-up with gastroenterologist, provided patient with information for GI office today and referral placed given amount of time since last appointment GI.

## 2024-03-29 ENCOUNTER — Ambulatory Visit (HOSPITAL_BASED_OUTPATIENT_CLINIC_OR_DEPARTMENT_OTHER): Payer: Self-pay | Admitting: Family Medicine

## 2024-03-29 LAB — CBC WITH DIFFERENTIAL/PLATELET
Basophils Absolute: 0.1 10*3/uL (ref 0.0–0.2)
Basos: 1 %
EOS (ABSOLUTE): 0.2 10*3/uL (ref 0.0–0.4)
Eos: 2 %
Hematocrit: 44.5 % (ref 37.5–51.0)
Hemoglobin: 15.1 g/dL (ref 13.0–17.7)
Immature Grans (Abs): 0 10*3/uL (ref 0.0–0.1)
Immature Granulocytes: 0 %
Lymphocytes Absolute: 2 10*3/uL (ref 0.7–3.1)
Lymphs: 27 %
MCH: 31 pg (ref 26.6–33.0)
MCHC: 33.9 g/dL (ref 31.5–35.7)
MCV: 91 fL (ref 79–97)
Monocytes Absolute: 0.3 10*3/uL (ref 0.1–0.9)
Monocytes: 4 %
Neutrophils Absolute: 4.7 10*3/uL (ref 1.4–7.0)
Neutrophils: 66 %
Platelets: 265 10*3/uL (ref 150–450)
RBC: 4.87 x10E6/uL (ref 4.14–5.80)
RDW: 12.2 % (ref 11.6–15.4)
WBC: 7.3 10*3/uL (ref 3.4–10.8)

## 2024-03-29 LAB — COMPREHENSIVE METABOLIC PANEL WITH GFR
ALT: 21 IU/L (ref 0–44)
AST: 25 IU/L (ref 0–40)
Albumin: 4.6 g/dL (ref 4.1–5.1)
Alkaline Phosphatase: 80 IU/L (ref 44–121)
BUN/Creatinine Ratio: 11 (ref 9–20)
BUN: 12 mg/dL (ref 6–20)
Bilirubin Total: 0.5 mg/dL (ref 0.0–1.2)
CO2: 25 mmol/L (ref 20–29)
Calcium: 9.5 mg/dL (ref 8.7–10.2)
Chloride: 100 mmol/L (ref 96–106)
Creatinine, Ser: 1.05 mg/dL (ref 0.76–1.27)
Globulin, Total: 2.9 g/dL (ref 1.5–4.5)
Glucose: 93 mg/dL (ref 70–99)
Potassium: 4.6 mmol/L (ref 3.5–5.2)
Sodium: 140 mmol/L (ref 134–144)
Total Protein: 7.5 g/dL (ref 6.0–8.5)
eGFR: 95 mL/min/{1.73_m2} (ref >59)

## 2024-03-29 LAB — LIPASE: Lipase: 14 U/L (ref 13–78)

## 2024-04-11 ENCOUNTER — Encounter: Payer: Self-pay | Admitting: Gastroenterology

## 2024-05-02 ENCOUNTER — Encounter (HOSPITAL_BASED_OUTPATIENT_CLINIC_OR_DEPARTMENT_OTHER): Payer: Self-pay | Admitting: Family Medicine

## 2024-05-02 ENCOUNTER — Other Ambulatory Visit (HOSPITAL_BASED_OUTPATIENT_CLINIC_OR_DEPARTMENT_OTHER): Payer: Self-pay | Admitting: *Deleted

## 2024-05-02 DIAGNOSIS — K863 Pseudocyst of pancreas: Secondary | ICD-10-CM

## 2024-05-02 DIAGNOSIS — R1084 Generalized abdominal pain: Secondary | ICD-10-CM

## 2024-05-02 DIAGNOSIS — R110 Nausea: Secondary | ICD-10-CM

## 2024-05-02 DIAGNOSIS — K8689 Other specified diseases of pancreas: Secondary | ICD-10-CM

## 2024-05-02 DIAGNOSIS — K851 Biliary acute pancreatitis without necrosis or infection: Secondary | ICD-10-CM

## 2024-05-02 DIAGNOSIS — Z8719 Personal history of other diseases of the digestive system: Secondary | ICD-10-CM

## 2024-05-03 ENCOUNTER — Telehealth (HOSPITAL_BASED_OUTPATIENT_CLINIC_OR_DEPARTMENT_OTHER): Payer: Self-pay | Admitting: *Deleted

## 2024-05-03 NOTE — Telephone Encounter (Signed)
 Patient advised to let us  know if he would like this sent elsewhere

## 2024-05-03 NOTE — Telephone Encounter (Signed)
 Copied from CRM [id number]. Topic: Referral - Status >> May 03, 2024  8:19 AM Antwanette L wrote: Reason for CRM: Olam from [facility] is calling to Let Dr. De Peru know that Dr. Renaye Barrows Specialist) is not able to see the  patient.

## 2024-06-04 ENCOUNTER — Other Ambulatory Visit (HOSPITAL_BASED_OUTPATIENT_CLINIC_OR_DEPARTMENT_OTHER): Payer: Self-pay | Admitting: Family Medicine

## 2024-06-04 DIAGNOSIS — R110 Nausea: Secondary | ICD-10-CM

## 2024-06-16 ENCOUNTER — Ambulatory Visit: Admitting: Gastroenterology

## 2024-06-16 ENCOUNTER — Encounter: Payer: Self-pay | Admitting: Gastroenterology

## 2024-06-16 VITALS — BP 120/72 | HR 62 | Ht 70.0 in | Wt 278.5 lb

## 2024-06-16 DIAGNOSIS — K863 Pseudocyst of pancreas: Secondary | ICD-10-CM | POA: Diagnosis not present

## 2024-06-16 NOTE — Patient Instructions (Signed)
 You have been scheduled for a CT scan of the abdomen and pelvis at [facility], 1st floor Radiology. You are scheduled on Thursday 06/23/24 at 2:30 pm . You should arrive 15 minutes prior to your appointment time for registration.     Please follow the written instructions below on the day of your exam:   1) Do not eat anything after 12:30 pm (4 hours prior to your test)   You may take any medications as prescribed with a small amount of water, if necessary. If you take any of the following medications: METFORMIN, GLUCOPHAGE, GLUCOVANCE, AVANDAMET, RIOMET, FORTAMET, ACTOPLUS MET, JANUMET, GLUMETZA or METAGLIP, you MAY be asked to HOLD this medication 48 hours AFTER the exam.   The purpose of you drinking the oral contrast is to aid in the visualization of your intestinal tract. The contrast solution may cause some diarrhea. Depending on your individual set of symptoms, you may also receive an intravenous injection of x-ray contrast/dye. Plan on being at [facility] for 45 minutes or longer, depending on the type of exam you are having performed.   If you have any questions regarding your exam or if you need to reschedule, you may call [facility] at [phone] between the hours of 8:00 am and 5:00 pm, Monday-Friday.

## 2024-06-16 NOTE — Progress Notes (Signed)
 06/16/2024 Benjamin Gregory [id number] 1985-12-08   HISTORY OF PRESENT ILLNESS: This is a 37 year old male who is a patient Dr. Melba.  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  Last seen here December 2021.  Last imaging November 2021.  Plan was for repeat imaging November 2022, but he had been doing well so no concerns to follow-up with.  He says that recently he has been having some nausea.  He says that he had an episode of severe nausea with vomiting and then since then he has been having frequent nausea.  Does not really seem to be affected by food, actually eating sometimes makes it feel better.  He says that alcohol does upset his stomach.  He says that when he is at home does not tend to be an issue, but seems to be more situational.  He has been using stress Gummies, ginger chews, Zofran  if needed.  No abdominal pain or other associated symptoms.  Says that PCP wanted him to have a recheck of his pseudocyst.  No abdominal pain or any other GI issues or complaints including heartburn/reflux, etc.   Past Medical History:  Diagnosis Date   Gallstones    Pseudocyst of pancreas    Past Surgical History:  Procedure Laterality Date   CHOLECYSTECTOMY N/A 03/29/2019   Procedure: LAPAROSCOPIC CHOLECYSTECTOMY WITH INTRAOPERATIVE CHOLANGIOGRAM;  Surgeon: Rubin Calamity, MD;  Location: [facility];  Service: General;  Laterality: N/A;    reports that he has never smoked. He has never been exposed to tobacco smoke. He has never used smokeless tobacco. He reports current alcohol use of about 4.0 standard drinks of alcohol per week. He reports that he does not use drugs. family history includes Healthy in his brother and mother; Hypertension in his father. No Known Allergies    Outpatient Encounter Medications as of 06/16/2024  Medication Sig   MULTIPLE VITAMIN PO 2 gummies daily   ondansetron   (ZOFRAN -ODT) 4 MG disintegrating tablet DISSOLVE 1 TO 2 TABLETS(4 TO 8 MG) ON THE TONGUE EVERY 8 HOURS AS NEEDED   OVER THE COUNTER MEDICATION Take by mouth daily. GOODBYE STRESS Gummies   No facility-administered encounter medications on file as of 06/16/2024.    REVIEW OF SYSTEMS  : All other systems reviewed and negative except where noted in the History of Present Illness.   PHYSICAL EXAM: BP 120/72 (BP Location: Right Arm, Patient Position: Sitting, Cuff Size: Normal)   Pulse 62   Ht 5' 10 (1.778 m)   Wt 278 lb 8 oz (126.3 kg)   BMI 39.96 kg/m  General: Well developed white male in no acute distress Head: Normocephalic and atraumatic Eyes:  Sclerae anicteric, conjunctiva pink. Ears: Normal auditory acuity Lungs: Clear throughout to auscultation; no W/R/R. Heart: Regular rate and rhythm; no M/R/G. Abdomen: Soft, non-distended.  BS present.  Non-tender. Musculoskeletal: Symmetrical with no gross deformities  Skin: No lesions on visible extremities Extremities: No edema  Neurological: Alert oriented x 4, grossly non-focal Psychological:  Alert and cooperative. Normal mood and affect  ASSESSMENT AND PLAN: 38 year old male with history of pancreatitis secondary to presumed biliary pathology status post cholecystectomy and also has a chronic pseudocyst.  Last seen here December 2021.  Last imaging November 2021.  No abdominal pain, but having a lot of issues with nausea recently.  Discussed that nausea could be several sources especially since this seems more situational and less related to food  intake.  Nonetheless we will plan for CT abdomen with p.o. and IV contrast to follow-up on the pseudocyst.  Recent CBC, CMP, lipase normal.   CC:  de [place], Quintin PARAS, MD

## 2024-06-21 NOTE — Progress Notes (Signed)
 Attending Physician's Attestation   I have reviewed the chart.   I agree with the Advanced Practitioner's note, impression, and recommendations with any updates as below. Chronic pseudocyst, with patient having done well for years after surveillance.  Will be interesting to see what the imaging shows.  Agree with moving forward with imaging as outlined.   Aloha Finner, MD [facility] Advanced Endoscopy Office # [phone]

## 2024-06-23 ENCOUNTER — Ambulatory Visit (HOSPITAL_COMMUNITY)
Admission: RE | Admit: 2024-06-23 | Discharge: 2024-06-23 | Disposition: A | Discharge status: Home / Self Care | Source: Ambulatory Visit | Attending: Gastroenterology | Admitting: Gastroenterology

## 2024-06-23 DIAGNOSIS — K863 Pseudocyst of pancreas: Secondary | ICD-10-CM | POA: Insufficient documentation

## 2024-06-23 DIAGNOSIS — K8689 Other specified diseases of pancreas: Secondary | ICD-10-CM | POA: Diagnosis not present

## 2024-06-23 MED ORDER — IOHEXOL 9 MG/ML PO SOLN
500.0000 mL | ORAL | Status: AC
  Administered 2024-06-23 (×2): 500 mL via ORAL

## 2024-06-23 MED ORDER — IOHEXOL 300 MG/ML  SOLN
100.0000 mL | Freq: Once | INTRAMUSCULAR | Status: AC | PRN
  Administered 2024-06-23: 100 mL via INTRAVENOUS

## 2024-07-04 ENCOUNTER — Ambulatory Visit: Payer: Self-pay | Admitting: Gastroenterology

## 2024-07-04 NOTE — Telephone Encounter (Signed)
 Pt returned call. Created appt time with Dr. Wilhelmenia for 07/12/2024 at 3:50 PM. Pt verbalized understanding and agrees to appt date/time.

## 2024-07-04 NOTE — Progress Notes (Signed)
 Attempted to reach pt to discuss scheduling an overbook with Dr. Wilhelmenia from 07/12/2024 at 3:50 PM. No answer, left vm for pt to return call.

## 2024-07-12 ENCOUNTER — Encounter: Payer: Self-pay | Admitting: Gastroenterology

## 2024-07-12 ENCOUNTER — Ambulatory Visit (INDEPENDENT_AMBULATORY_CARE_PROVIDER_SITE_OTHER): Admitting: Gastroenterology

## 2024-07-12 VITALS — BP 102/70 | HR 66 | Ht 70.0 in | Wt 275.6 lb

## 2024-07-12 DIAGNOSIS — R1114 Bilious vomiting: Secondary | ICD-10-CM | POA: Diagnosis not present

## 2024-07-12 DIAGNOSIS — K863 Pseudocyst of pancreas: Secondary | ICD-10-CM | POA: Diagnosis not present

## 2024-07-12 DIAGNOSIS — R112 Nausea with vomiting, unspecified: Secondary | ICD-10-CM

## 2024-07-12 NOTE — Progress Notes (Unsigned)
 GASTROENTEROLOGY OUTPATIENT CLINIC VISIT   Primary Care Provider de [place], Quintin PARAS, MD [address] [place] [address] [phone]   Patient Profile: Benjamin Gregory is a 38 y.o. male with a pmh significant for prior necrotizing pancreatitis (GB related), status post cholecystectomy, chronic pseudocyst.  The patient presents to the [facility] for an evaluation and management of problem(s) noted below:  Problem List 1. Pancreatic pseudocyst   2. Bilious vomiting with nausea    Discussed the use of AI scribe software for clinical note transcription with the patient, who gave verbal consent to proceed.  History of Present Illness Please see prior notes for full details of HPI.  Interval History Benjamin Gregory is a 38 year old male with a history of prior gallstone necrotizing pancreatitis and chronic pseudocyst who presents for follow-up with newer issues of progressive nausea and vomiting. He is accompanied by his wife today.  Seen recently by PA Zehr with updated imaging showing an overall relatively large chronic pseudocyst (full details below).  I have not seen him myself in almost 4 years.  We had spoken previously years ago to aspirate the collection but as he was doing well with no symptoms we held off on this and he went on with his life.  Approximately three months ago, he experienced an episode of severe nausea and vomiting, during which he passed out.  There was no pain associated with this (to him nothing reminiscent of his pancreatitis).  Since then, he has experienced persistent nausea, particularly when engaging in normal activities such as going to work, attending church, or driving. These activities often lead to nausea and a sensation of his jaw tightening and throat discomfort, as if 'fighting something back'.  His symptoms have been variable, with some good weeks and some bad weeks. He recalls a particularly bad night two weeks ago. Despite the  nausea, he does not experience any pain. 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  He and wife feel they are ready to feel better and are interested in cyst drainage.   GI Review of Systems Positive as above Negative for dysphagia, odynophagia, pain, bloating, melena, hematochezia   Review of Systems General: Denies fevers/chills/weight loss unintentionally Cardiovascular: Denies chest pain Pulmonary: Denies shortness of breath Gastroenterological: See HPI Genitourinary: Denies darkened urine Hematological: Denies easy bruising/bleeding Dermatological: Denies jaundice Psychological: Mood is stable   Medications Current Outpatient Medications  Medication Sig Dispense Refill   MULTIPLE VITAMIN PO 2 gummies daily     ondansetron  (ZOFRAN -ODT) 4 MG disintegrating tablet DISSOLVE 1 TO 2 TABLETS(4 TO 8 MG) ON THE TONGUE EVERY 8 HOURS AS NEEDED 20 tablet 0   OVER THE COUNTER MEDICATION Take by mouth daily. GOODBYE STRESS Gummies     No current facility-administered medications for this visit.    Allergies No Known Allergies  Histories Past Medical History:  Diagnosis Date   Gallstones    Pseudocyst of pancreas    Past Surgical History:  Procedure Laterality Date   CHOLECYSTECTOMY N/A 03/29/2019   Procedure: LAPAROSCOPIC CHOLECYSTECTOMY WITH INTRAOPERATIVE CHOLANGIOGRAM;  Surgeon: Rubin Calamity, MD;  Location: [facility];  Service: General;  Laterality: N/A;   Social History   Socioeconomic History   Marital status: Married    Spouse name: Not on file   Number of children: 0   Years of education: Not on file   Highest education level: Not on file  Occupational History   Occupation: Account  manager  Tobacco Use   Smoking status: Never    Passive exposure: Never   Smokeless tobacco: Never  Vaping Use   Vaping status: Never Used  Substance and Sexual Activity   Alcohol use: Yes    Alcohol/week: 4.0 standard drinks of alcohol     Types: 4 Standard drinks or equivalent per week   Drug use: Never   Sexual activity: Not on file  Other Topics Concern   Not on file  Social History Narrative   Not on file   Social Drivers of Health   Financial Resource Strain: Low Risk  (07/29/2023)   Overall Financial Resource Strain (CARDIA)    Difficulty of Paying Living Expenses: Not hard at all  Food Insecurity: No Food Insecurity (07/29/2023)   Hunger Vital Sign    Worried About Running Out of Food in the Last Year: Never true    Ran Out of Food in the Last Year: Never true  Transportation Needs: No Transportation Needs (07/29/2023)   PRAPARE - Administrator, Civil Service (Medical): No    Lack of Transportation (Non-Medical): No  Physical Activity: Sufficiently Active (07/29/2023)   Exercise Vital Sign    Days of Exercise per Week: 3 days    Minutes of Exercise per Session: 60 min  Stress: No Stress Concern Present (07/29/2023)   [employer] of Occupational Health - Occupational Stress Questionnaire    Feeling of Stress : Not at all  Social Connections: Socially Isolated (07/29/2023)   Social Connection and Isolation Panel    Frequency of Communication with Friends and Family: Once a week    Frequency of Social Gatherings with Friends and Family: Once a week    Attends Religious Services: Never    Database administrator or Organizations: No    Attends Banker Meetings: Never    Marital Status: Married  Catering manager Violence: Not At Risk (07/29/2023)   Humiliation, Afraid, Rape, and Kick questionnaire    Fear of Current or Ex-Partner: No    Emotionally Abused: No    Physically Abused: No    Sexually Abused: No   Family History  Problem Relation Age of Onset   Healthy Mother    Hypertension Father    Healthy Brother    Colon cancer Neg Hx    Esophageal cancer Neg Hx    Inflammatory bowel disease Neg Hx    Liver disease Neg Hx    Pancreatic cancer Neg Hx    Rectal cancer Neg Hx     Stomach cancer Neg Hx    I have reviewed his medical, social, and family history in detail and updated the electronic medical record as necessary.    PHYSICAL EXAMINATION  BP 102/70   Pulse 66   Ht 5' 10 (1.778 m)   Wt 275 lb 9.6 oz (125 kg)   BMI 39.54 kg/m  GEN: NAD, appears stated age, doesn't appear chronically ill, accompanied by wife PSYCH: Cooperative, without pressured speech EYE: Conjunctivae pink, sclerae anicteric  ENT: MMM CV: Nontachycardic RESP: No audible wheezing GI: NABS, soft, rounded, protuberant abdomen, nontender, no rebound MSK/EXT: No lower extremity edema SKIN: No jaundice NEURO:  Alert & Oriented x 3, no focal deficits   REVIEW OF DATA  I reviewed the following data at the time of this encounter:  GI Procedures and Studies  No relevant studies to review  Laboratory Studies  Reviewed those in epic  Imaging Studies  9/25 CTAP IMPRESSION: 1. Some  interval development of peripheral calcification, but otherwise unchanged large ventral fluid collection arising from the ventral pancreas measuring 15.1 x 10.3 cm. No pancreatic ductal dilatation or surrounding acute inflammatory changes. Findings are consistent with a very large, chronic pseudocyst. 2. Cholecystectomy.   ASSESSMENT/PLAN  Mr. Mccleod is a 38 y.o. male with a pmh significant for pancreatitis secondary to presumed biliary pathology, status post cholecystectomy, chronic pseudocyst.  The patient is seen today for evaluation and management of:  1. Pancreatic pseudocyst   2. Bilious vomiting with nausea    The patient is clinically and hemodynamically stable at this time.  Chronic pancreatic pseudocyst with associated chronic nausea Chronic pancreatic pseudocyst present for over five years, with recent onset of progressive nausea and intermittent symptoms. The pseudocyst is large and on imaging (as it has for years) is likely causing compressive mass-effects on the stomach. The chronic  nature of the pseudocyst makes it unusual for new symptoms to arise, but the mass effect could be contributing to the nausea.  We may not know how much better he can be until this is drained/aspirated.  Once drained, we will likely perform follow-up imaging in 2.5 to 3 weeks to assess cyst size reduction with eventual stent removal 4-5 weeks from placement.  Risks associated with EUS cystgastrostomy were discussed including bleeding, perforation, aspiration, medication effects, pancreatitis (1-2% chance).  He and wife are in agreement to moving forward. - Proceed with EUS for attempt at pseudocystgastrostomy creation - Monitor for complications post procedure - Advise against travel for at least one week post-procedure to monitor for complications. - If issues of nausea persist post drainage, then will work things up further from there.   All patient questions were answered to the best of my ability, and the patient agrees to the aforementioned plan of action with follow-up as indicated.   Orders Placed This Encounter  Procedures   Procedural/ Surgical Case Request: ULTRASOUND, UPPER GI TRACT, ENDOSCOPIC   Ambulatory referral to Gastroenterology    New Prescriptions   No medications on file   Modified Medications   No medications on file    Planned Follow Up No follow-ups on file.   Total Time in Face-to-Face and in Coordination of Care for patient including independent/personal interpretation/review of prior testing, medical history, examination, medication adjustment, communicating results with the patient directly, and documentation with the EHR is 30 minutes.   Aloha Finner, MD [facility] Advanced Endoscopy Office # [phone]

## 2024-07-12 NOTE — Patient Instructions (Addendum)
 You have been scheduled for an endoscopy. Please follow written instructions given to you at your visit today.  If you use inhalers (even only as needed), please bring them with you on the day of your procedure.  If you take any of the following medications, they will need to be adjusted prior to your procedure:   DO NOT TAKE 7 DAYS PRIOR TO TEST- Trulicity (dulaglutide) Ozempic, Wegovy (semaglutide) Mounjaro (tirzepatide) Bydureon Bcise (exanatide extended release)  DO NOT TAKE 1 DAY PRIOR TO YOUR TEST Rybelsus (semaglutide) Adlyxin (lixisenatide) Victoza (liraglutide) Byetta (exanatide) _________________________________________________________________________  If your blood pressure at your visit was 140/90 or greater, please contact your primary care physician to follow up on this.  _______________________________________________________  If you are age 6 or older, your body mass index should be between 23-30. Your Body mass index is 39.54 kg/m. If this is out of the aforementioned range listed, please consider follow up with your Primary Care Provider.  If you are age 54 or younger, your body mass index should be between 19-25. Your Body mass index is 39.54 kg/m. If this is out of the aformentioned range listed, please consider follow up with your Primary Care Provider.   ________________________________________________________  The [facility] providers would like to encourage you to use MYCHART to communicate with providers for non-urgent requests or questions.  Due to long hold times on the telephone, sending your provider a message by [facility] may be a faster and more efficient way to get a response.  Please allow 48 business hours for a response.  Please remember that this is for non-urgent requests.  _______________________________________________________  [facility] is using a team-based approach to care.  Your team is made up of your doctor and two to three  APPS. Our APPS (Nurse Practitioners and Physician Assistants) work with your physician to ensure care continuity for you. They are fully qualified to address your health concerns and develop a treatment plan. They communicate directly with your gastroenterologist to care for you. Seeing the Advanced Practice Practitioners on your physician's team can help you by facilitating care more promptly, often allowing for earlier appointments, access to diagnostic testing, procedures, and other specialty referrals.   Due to recent changes in healthcare laws, you may see the results of your imaging and laboratory studies on MyChart before your provider has had a chance to review them.  We understand that in some cases there may be results that are confusing or concerning to you. Not all laboratory results come back in the same time frame and the provider may be waiting for multiple results in order to interpret others.  Please give us  48 hours in order for your provider to thoroughly review all the results before contacting the office for clarification of your results.   Thank you for choosing me and [facility].  Dr. Wilhelmenia

## 2024-07-13 ENCOUNTER — Encounter: Payer: Self-pay | Admitting: Gastroenterology

## 2024-07-13 DIAGNOSIS — R1114 Bilious vomiting: Secondary | ICD-10-CM | POA: Insufficient documentation

## 2024-07-14 ENCOUNTER — Ambulatory Visit: Admitting: Gastroenterology

## 2024-07-21 ENCOUNTER — Other Ambulatory Visit (HOSPITAL_BASED_OUTPATIENT_CLINIC_OR_DEPARTMENT_OTHER): Payer: Self-pay | Admitting: Family Medicine

## 2024-07-21 DIAGNOSIS — R110 Nausea: Secondary | ICD-10-CM

## 2024-07-29 ENCOUNTER — Ambulatory Visit (HOSPITAL_BASED_OUTPATIENT_CLINIC_OR_DEPARTMENT_OTHER): Payer: BC Managed Care – PPO | Admitting: Family Medicine

## 2024-07-29 ENCOUNTER — Encounter (HOSPITAL_BASED_OUTPATIENT_CLINIC_OR_DEPARTMENT_OTHER): Payer: Self-pay | Admitting: Family Medicine

## 2024-07-29 VITALS — BP 100/64 | HR 62 | Ht 70.0 in | Wt 277.2 lb

## 2024-07-29 DIAGNOSIS — Z23 Encounter for immunization: Secondary | ICD-10-CM

## 2024-07-29 DIAGNOSIS — Z0000 Encounter for general adult medical examination without abnormal findings: Secondary | ICD-10-CM | POA: Diagnosis not present

## 2024-07-29 NOTE — Patient Instructions (Signed)
  Medication Instructions:  Your physician recommends that you continue on your current medications as directed. Please refer to the Current Medication list given to you today. --If you need a refill on any your medications before your next appointment, please call your pharmacy first. If no refills are authorized on file call the office.-- Lab Work: Your physician has recommended that you have lab work today: 1 week before next visit If you have labs (blood work) drawn today and your tests are completely normal, you will receive your results via MyChart message OR a phone call from our staff.  Please ensure you check your voicemail in the event that you authorized detailed messages to be left on a delegated number. If you have any lab test that is abnormal or we need to change your treatment, we will call you to review the results.    Follow-Up: Your next appointment:   Your physician recommends that you schedule a follow-up appointment in: 1 year physical with Dr. de Peru  You will receive a text message or e-mail with a link to a survey about your care and experience with [place] today! We would greatly appreciate your feedback!   Thanks for letting us be apart of your health journey!!  Primary Care and Sports Medicine   Dr. Ceasar Mons Peru   We encourage you to activate your patient portal called "MyChart".  Sign up information is provided on this After Visit Summary.  MyChart is used to connect with patients for Virtual Visits (Telemedicine).  Patients are able to view lab/test results, encounter notes, upcoming appointments, etc.  Non-urgent messages can be sent to your provider as well. To learn more about what you can do with MyChart, please visit --  [email or web address].

## 2024-07-29 NOTE — Progress Notes (Signed)
 Subjective:    CC: Annual Physical Exam  HPI: Benjamin Gregory is a 38 y.o. presenting for annual physical  I reviewed the past medical history, family history, social history, surgical history, and allergies today and no changes were needed.  Please see the problem list section below in epic for further details.  Past Medical History: Past Medical History:  Diagnosis Date   Gallstones    Pseudocyst of pancreas    Past Surgical History: Past Surgical History:  Procedure Laterality Date   CHOLECYSTECTOMY N/A 03/29/2019   Procedure: LAPAROSCOPIC CHOLECYSTECTOMY WITH INTRAOPERATIVE CHOLANGIOGRAM;  Surgeon: Rubin Calamity, MD;  Location: [facility];  Service: General;  Laterality: N/A;   Social History: Social History   Socioeconomic History   Marital status: Married    Spouse name: Not on file   Number of children: 0   Years of education: Not on file   Highest education level: Bachelor's degree (e.g., BA, AB, BS)  Occupational History   Occupation: Scientist, water quality  Tobacco Use   Smoking status: Never    Passive exposure: Never   Smokeless tobacco: Never  Vaping Use   Vaping status: Never Used  Substance and Sexual Activity   Alcohol use: Yes    Alcohol/week: 4.0 standard drinks of alcohol   Drug use: Never   Sexual activity: Yes    Birth control/protection: None  Other Topics Concern   Not on file  Social History Narrative   Not on file   Social Drivers of Health   Financial Resource Strain: Low Risk  (07/28/2024)   Overall Financial Resource Strain (CARDIA)    Difficulty of Paying Living Expenses: Not hard at all  Food Insecurity: No Food Insecurity (07/28/2024)   Hunger Vital Sign    Worried About Running Out of Food in the Last Year: Never true    Ran Out of Food in the Last Year: Never true  Transportation Needs: No Transportation Needs (07/28/2024)   PRAPARE - Administrator, Civil Service (Medical): No    Lack of Transportation (Non-Medical): No   Physical Activity: Insufficiently Active (07/28/2024)   Exercise Vital Sign    Days of Exercise per Week: 2 days    Minutes of Exercise per Session: 60 min  Stress: Stress Concern Present (07/28/2024)   [employer] of Occupational Health - Occupational Stress Questionnaire    Feeling of Stress: To some extent  Social Connections: Socially Integrated (07/28/2024)   Social Connection and Isolation Panel    Frequency of Communication with Friends and Family: Once a week    Frequency of Social Gatherings with Friends and Family: More than three times a week    Attends Religious Services: More than 4 times per year    Active Member of [employer] or Organizations: Yes    Attends Engineer, structural: More than 4 times per year    Marital Status: Married   Family History: Family History  Problem Relation Age of Onset   Healthy Mother    Hypertension Father    Healthy Brother    Colon cancer Neg Hx    Esophageal cancer Neg Hx    Inflammatory bowel disease Neg Hx    Liver disease Neg Hx    Pancreatic cancer Neg Hx    Rectal cancer Neg Hx    Stomach cancer Neg Hx    Allergies: No Known Allergies Medications: See med rec.  Review of Systems: No headache, visual changes, nausea, vomiting, diarrhea, constipation, dizziness, abdominal pain, skin rash,  fevers, chills, night sweats, swollen lymph nodes, weight loss, chest pain, body aches, joint swelling, muscle aches, shortness of breath, mood changes, visual or auditory hallucinations.  Objective:    BP 100/64 (BP Location: Right Arm, Patient Position: Sitting, Cuff Size: Large)   Pulse 62   Ht 5' 10 (1.778 m)   Wt 277 lb 3.2 oz (125.7 kg)   SpO2 97%   BMI 39.77 kg/m   General: Well Developed, well nourished, and in no acute distress.  Neuro: Alert and oriented x3, extra-ocular muscles intact, sensation grossly intact. Cranial nerves II through XII are intact, motor, sensory, and coordinative functions are all  intact. HEENT: Normocephalic, atraumatic, pupils equal round reactive to light, neck supple, no masses, no lymphadenopathy, thyroid nonpalpable. Oropharynx, nasopharynx, external ear canals are unremarkable. Skin: Warm and dry, no rashes noted.  Cardiac: Regular rate and rhythm, no murmurs rubs or gallops.  Respiratory: Clear to auscultation bilaterally. Not using accessory muscles, speaking in full sentences.  Abdominal: Soft, nontender, nondistended, positive bowel sounds, no masses, no organomegaly.  Musculoskeletal: Shoulder, elbow, wrist, hip, knee, ankle stable, and with full range of motion.  Impression and Recommendations:    Wellness examination Assessment & Plan: Routine HCM labs reviewed. HCM reviewed/discussed. Anticipatory guidance regarding healthy weight, lifestyle and choices given. Recommend healthy diet.  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  Return in about 1 year (around 07/29/2025) for CPE.   ___________________________________________ Levia Waltermire de [place], MD, ABFM, CAQSM Primary Care and Sports Medicine [facility]

## 2024-07-29 NOTE — Assessment & Plan Note (Signed)
 Routine HCM labs reviewed. HCM reviewed/discussed. Anticipatory guidance regarding healthy weight, lifestyle and choices given. Recommend healthy diet.  Recommend approximately 150 minutes/week of moderate intensity exercise Recommend regular dental and vision exams Always use seatbelt/lap and shoulder restraints Recommend using smoke alarms and checking batteries at least twice a year Recommend using sunscreen when outside Discussed immunization recommendations Recommend seasonal flu vaccine, patient amenable, administered today

## 2024-08-17 ENCOUNTER — Telehealth: Payer: Self-pay | Admitting: Gastroenterology

## 2024-08-17 NOTE — Telephone Encounter (Addendum)
 Procedure:Upper EUS Procedure date: 08/25/24 Procedure location: [facility] Arrival Time: 1:40 pm Spoke with the patient Y/N: Yes Any prep concerns? No  Has the patient obtained the prep from the pharmacy ? No prep needed Do you have a care partner and transportation: Yes Any additional concerns? No

## 2024-08-19 ENCOUNTER — Encounter (HOSPITAL_COMMUNITY): Payer: Self-pay | Admitting: Gastroenterology

## 2024-08-25 ENCOUNTER — Ambulatory Visit (HOSPITAL_COMMUNITY): Payer: Self-pay | Admitting: Anesthesiology

## 2024-08-25 ENCOUNTER — Ambulatory Visit (HOSPITAL_COMMUNITY)

## 2024-08-25 ENCOUNTER — Encounter (HOSPITAL_COMMUNITY)
Admission: RE | Disposition: A | Discharge status: Home / Self Care | Payer: Self-pay | Source: Home / Self Care | Attending: Gastroenterology

## 2024-08-25 ENCOUNTER — Telehealth: Payer: Self-pay

## 2024-08-25 ENCOUNTER — Encounter (HOSPITAL_COMMUNITY): Payer: Self-pay | Admitting: Gastroenterology

## 2024-08-25 ENCOUNTER — Ambulatory Visit (HOSPITAL_COMMUNITY)
Admission: RE | Admit: 2024-08-25 | Discharge: 2024-08-25 | Disposition: A | Discharge status: Home / Self Care | Attending: Gastroenterology | Admitting: Gastroenterology

## 2024-08-25 ENCOUNTER — Other Ambulatory Visit: Payer: Self-pay

## 2024-08-25 DIAGNOSIS — K3189 Other diseases of stomach and duodenum: Secondary | ICD-10-CM | POA: Diagnosis not present

## 2024-08-25 DIAGNOSIS — Z6839 Body mass index (BMI) 39.0-39.9, adult: Secondary | ICD-10-CM | POA: Insufficient documentation

## 2024-08-25 DIAGNOSIS — I899 Noninfective disorder of lymphatic vessels and lymph nodes, unspecified: Secondary | ICD-10-CM | POA: Diagnosis not present

## 2024-08-25 DIAGNOSIS — K863 Pseudocyst of pancreas: Secondary | ICD-10-CM

## 2024-08-25 DIAGNOSIS — R1114 Bilious vomiting: Secondary | ICD-10-CM

## 2024-08-25 DIAGNOSIS — E669 Obesity, unspecified: Secondary | ICD-10-CM | POA: Insufficient documentation

## 2024-08-25 DIAGNOSIS — K862 Cyst of pancreas: Secondary | ICD-10-CM | POA: Diagnosis not present

## 2024-08-25 DIAGNOSIS — R1013 Epigastric pain: Secondary | ICD-10-CM | POA: Diagnosis not present

## 2024-08-25 DIAGNOSIS — K2289 Other specified disease of esophagus: Secondary | ICD-10-CM | POA: Diagnosis not present

## 2024-08-25 DIAGNOSIS — R112 Nausea with vomiting, unspecified: Secondary | ICD-10-CM | POA: Diagnosis not present

## 2024-08-25 DIAGNOSIS — K319 Disease of stomach and duodenum, unspecified: Secondary | ICD-10-CM

## 2024-08-25 HISTORY — PX: ESOPHAGOGASTRODUODENOSCOPY: SHX5428

## 2024-08-25 HISTORY — PX: EUS: SHX5427

## 2024-08-25 SURGERY — ULTRASOUND, UPPER GI TRACT, ENDOSCOPIC
Anesthesia: General

## 2024-08-25 MED ORDER — CIPROFLOXACIN IN D5W 400 MG/200ML IV SOLN
INTRAVENOUS | Status: AC
  Filled 2024-08-25: qty 200

## 2024-08-25 MED ORDER — PROPOFOL 1000 MG/100ML IV EMUL
INTRAVENOUS | Status: AC
  Filled 2024-08-25: qty 100

## 2024-08-25 MED ORDER — ONDANSETRON HCL 4 MG/2ML IJ SOLN
4.0000 mg | Freq: Once | INTRAMUSCULAR | Status: AC
  Administered 2024-08-25: 4 mg via INTRAVENOUS

## 2024-08-25 MED ORDER — GLYCOPYRROLATE PF 0.2 MG/ML IJ SOSY
PREFILLED_SYRINGE | INTRAMUSCULAR | Status: DC | PRN
  Administered 2024-08-25: .2 mg via INTRAVENOUS

## 2024-08-25 MED ORDER — FENTANYL CITRATE (PF) 100 MCG/2ML IJ SOLN
INTRAMUSCULAR | Status: DC | PRN
  Administered 2024-08-25 (×2): 50 ug via INTRAVENOUS

## 2024-08-25 MED ORDER — CIPROFLOXACIN IN D5W 400 MG/200ML IV SOLN
INTRAVENOUS | Status: DC | PRN
  Administered 2024-08-25: 400 mg via INTRAVENOUS

## 2024-08-25 MED ORDER — ONDANSETRON HCL 4 MG/2ML IJ SOLN
INTRAMUSCULAR | Status: AC
  Filled 2024-08-25: qty 2

## 2024-08-25 MED ORDER — SUGAMMADEX SODIUM 200 MG/2ML IV SOLN
INTRAVENOUS | Status: DC | PRN
  Administered 2024-08-25: 200 mg via INTRAVENOUS

## 2024-08-25 MED ORDER — ROCURONIUM BROMIDE 100 MG/10ML IV SOLN
INTRAVENOUS | Status: DC | PRN
  Administered 2024-08-25: 50 mg via INTRAVENOUS

## 2024-08-25 MED ORDER — PROPOFOL 10 MG/ML IV BOLUS
INTRAVENOUS | Status: DC | PRN
  Administered 2024-08-25 (×2): 200 mg via INTRAVENOUS

## 2024-08-25 MED ORDER — FENTANYL CITRATE (PF) 100 MCG/2ML IJ SOLN
INTRAMUSCULAR | Status: AC
  Filled 2024-08-25: qty 2

## 2024-08-25 MED ORDER — SODIUM CHLORIDE 0.9 % IV SOLN: INTRAVENOUS | Status: DC

## 2024-08-25 MED ORDER — CIPROFLOXACIN HCL 500 MG PO TABS: 500.0000 mg | ORAL_TABLET | Freq: Two times a day (BID) | ORAL | 0 refills | Status: AC

## 2024-08-25 NOTE — Anesthesia Procedure Notes (Signed)
 Procedure Name: Intubation Date/Time: 08/25/2024 3:34 PM  Performed by: Nada Corean CROME, CRNAPre-anesthesia Checklist: Emergency Drugs available, Patient identified, Suction available, Patient being monitored and Timeout performed Patient Re-evaluated:Patient Re-evaluated prior to induction Oxygen Delivery Method: Circle system utilized Preoxygenation: Pre-oxygenation with 100% oxygen Induction Type: IV induction Ventilation: Mask ventilation without difficulty Laryngoscope Size: Mac and 4 Grade View: Grade I Tube type: Oral Tube size: 7.5 mm Number of attempts: 1 Airway Equipment and Method: Stylet Placement Confirmation: ETT inserted through vocal cords under direct vision, positive ETCO2 and breath sounds checked- equal and bilateral Secured at: 21 cm Tube secured with: Tape Dental Injury: Teeth and Oropharynx as per pre-operative assessment

## 2024-08-25 NOTE — Op Note (Signed)
 [facility] Patient Name: Benjamin Gregory Procedure Date: 08/25/2024 MRN: [id number] Attending MD: Aloha Finner , MD, [id number] Date of Birth: 19-Aug-1986 CSN: [id number] Age: 38 Admit Type: Ambulatory Procedure:                Upper EUS Indications:              Pancreatic cyst on CT scan, Pancreatic pseudocyst,                            Epigastric abdominal pain, Nausea with vomiting Providers:                Aloha Finner, MD, Hoy Penner, RN, [place], Technician Referring MD:              Medicines:                Monitored Anesthesia Care, Cipro 400 mg IV Complications:            No immediate complications. Estimated Blood Loss:     Estimated blood loss was minimal. Procedure:                Pre-Anesthesia Assessment:                           - Prior to the procedure, a History and Physical                            was performed, and patient medications and                            allergies were reviewed. The patient's tolerance of                            previous anesthesia was also reviewed. The risks                            and benefits of the procedure and the sedation                            options and risks were discussed with the patient.                            All questions were answered, and informed consent                            was obtained. Prior Anticoagulants: The patient has                            taken no anticoagulant or antiplatelet agents. ASA                            Grade Assessment: II - A patient with mild systemic  disease. After reviewing the risks and benefits,                            the patient was deemed in satisfactory condition to                            undergo the procedure.                           After obtaining informed consent, the endoscope was                            passed under direct vision. Throughout the                             procedure, the patient's blood pressure, pulse, and                            oxygen saturations were monitored continuously. The                            [id number]-[id number] ([id number]) Olympus endoscope was introduced                            through the mouth, and advanced to the second part                            of duodenum. The [id number] ([id number]) Olympus                            ultrasound scope was introduced through the mouth,                            and advanced to the duodenum for ultrasound                            examination from the esophagus, stomach and                            duodenum. The upper EUS was accomplished without                            difficulty. The patient tolerated the procedure. Scope In: Scope Out: Findings:      ENDOSCOPIC FINDING: :      No gross lesions were noted in the entire esophagus.      The Z-line was irregular and was found 40 cm from the incisors.      Patchy mildly erythematous mucosa without bleeding was found in the       entire examined stomach. Biopsies were taken with a cold forceps for       histology and Helicobacter pylori testing.      No gross lesions were noted in the duodenal bulb, in the first portion       of the duodenum and in the second portion of the duodenum.  The major papilla was normal.      ENDOSONOGRAPHIC FINDING: :      An anechoic lesion suggestive of a cyst was identified in the pancreatic       body-tail region. The lesion measured 145 mm by 120 mm in maximal       cross-sectional diameter. There was a single compartment without septae.       The outer wall of the lesion was thin. There was no associated mass.       There was no internal debris within the fluid-filled cavity. The       decision was made to create a cystogastrostomy using the AXIOS stent       system. Once an appropriate position in the stomach was identified, the       common wall between the stomach and  the cyst was interrogated utilizing       color Doppler imaging to identify interposed vessels. The AXIOS stent       and electrocautery device were introduced through the working channel.       The AXIOS catheter was advanced to the common wall between the stomach       and the cyst. Current was applied to the cautery tip and then the       catheter was advanced into the cyst. A 15 x 10 mm AXIOS stent was placed       with the flanges in close approximation to the walls of the cyst and the       stomach through the cystogastrostomy. The stent was successfully placed.       A TTS dilator was passed through the scope. Dilation with an 05-28-09 mm       pyloric balloon dilator was performed up to 10 mm. A 7 cm 10 Fr double       pigtail stent was placed through the AXIOS into the pseudocyst using a       stent introducer set. The stent was successfully placed. Suction via       Endoscope was performed and 1000 mL of fluid were removed.      Endosonographic imaging of the ampulla showed no intramural       (subepithelial) lesion.      Endosonographic imaging in the thoracic esophagus and in the       gastroesophageal junction showed no mass.      Endosonographic imaging in the visualized portion of the liver showed no       mass.      No malignant-appearing lymph nodes were visualized in the paracardial       region (level 16), gastrohepatic ligament (level 18) and celiac region       (level 20).      The celiac region was visualized.      The esophagus, stomach and duodenum were examined endosonographically. Impression:               EGD Impression:                           - No gross lesions in the entire esophagus. Z-line                            irregular, 40 cm from the incisors.                           -  Erythematous mucosa in the stomach. Biopsied.                           - No gross lesions in the duodenal bulb, in the                            first portion of the duodenum and  in the second                            portion of the duodenum.                           - Normal major papilla.                           EUS Impression:                           - A cystic lesion was seen in the pancreatic                            body-tail region. Tissue has not been obtained.                            However, the endosonographic appearance is                            consistent with a pancreatic pseudocyst.                            Pseudocystgastrostomy created with 15 mm x 10 mm                            AXIOS and double pigtail placed through it. 1000 mL                            of fluid removed.                           - No malignant-appearing lymph nodes were                            visualized in the paracardial region (level 16),                            gastrohepatic ligament (level 18) and celiac region                            (level 20). Moderate Sedation:      Not Applicable - Patient had care per Anesthesia. Recommendation:           - The patient will be observed post-procedure,                            until all discharge criteria are met.                           -  Discharge patient to home.                           - Patient has a contact number available for                            emergencies. The signs and symptoms of potential                            delayed complications were discussed with the                            patient. Return to normal activities tomorrow.                            Written discharge instructions were provided to the                            patient.                           - Full liquid diet today.                           - Observe patient's clinical course.                           - Await path results.                           - Ciprofloxacin x 10-days.                           - No NSAIDs x 1 week.                           - No antiacids if possible during stent.                            - Continue present medications otherwise.                           - CT abdomen with IV and oral contrast in 2 weeks.                           - EGD in 3-4 weeks pending cyst drainage looks                            adequate.                           - The findings and recommendations were discussed                            with the patient.                           -  The findings and recommendations were discussed                            with the patient's family. Procedure Code(s):        --- Professional ---                           [phone], Esophagogastroduodenoscopy, flexible,                            transoral; with transmural drainage of pseudocyst                            (includes placement of transmural drainage                            catheter[s]/stent[s], when performed, and                            endoscopic ultrasound, when performed)                           43237, Esophagogastroduodenoscopy, flexible,                            transoral; with endoscopic ultrasound examination                            limited to the esophagus, stomach or duodenum, and                            adjacent structures                           43239, 59, Esophagogastroduodenoscopy, flexible,                            transoral; with biopsy, single or multiple Diagnosis Code(s):        --- Professional ---                           K22.89, Other specified disease of esophagus                           K31.89, Other diseases of stomach and duodenum                           K86.2, Cyst of pancreas                           I89.9, Noninfective disorder of lymphatic vessels                            and lymph nodes, unspecified                           K86.3, Pseudocyst of pancreas  R10.13, Epigastric pain                           R11.2, Nausea with vomiting, unspecified CPT copyright 2022 American Medical Association. All  rights reserved. The codes documented in this report are preliminary and upon coder review may  be revised to meet current compliance requirements. Aloha Finner, MD 08/25/2024 4:32:06 PM Number of Addenda: 0

## 2024-08-25 NOTE — Discharge Instructions (Signed)

## 2024-08-25 NOTE — Anesthesia Preprocedure Evaluation (Addendum)
 Anesthesia Evaluation  Patient identified by MRN, date of birth, ID band Patient awake    Reviewed: Allergy & Precautions, H&P , NPO status , Patient's Chart, lab work & pertinent test results  Airway Mallampati: II  TM Distance: >3 FB Neck ROM: Full    Dental  (+) Dental Advisory Given   Pulmonary neg pulmonary ROS   Pulmonary exam normal breath sounds clear to auscultation       Cardiovascular Exercise Tolerance: Good negative cardio ROS Normal cardiovascular exam Rhythm:Regular Rate:Normal     Neuro/Psych negative neurological ROS  negative psych ROS   GI/Hepatic negative GI ROS, Neg liver ROS,,,  Endo/Other  negative endocrine ROS    Renal/GU negative Renal ROS  negative genitourinary   Musculoskeletal negative musculoskeletal ROS (+)    Abdominal  (+) + obese  Peds  Hematology negative hematology ROS (+)   Anesthesia Other Findings   Reproductive/Obstetrics negative OB ROS                              Anesthesia Physical Anesthesia Plan  ASA: 2  Anesthesia Plan: General   Post-op Pain Management: Minimal or no pain anticipated   Induction: Intravenous  PONV Risk Score and Plan: 4 or greater and Ondansetron  and Propofol  infusion  Airway Management Planned: Oral ETT  Additional Equipment: None  Intra-op Plan:   Post-operative Plan: Extubation in OR  Informed Consent: I have reviewed the patients History and Physical, chart, labs and discussed the procedure including the risks, benefits and alternatives for the proposed anesthesia with the patient or authorized representative who has indicated his/her understanding and acceptance.     Dental advisory given  Plan Discussed with: CRNA and Anesthesiologist  Anesthesia Plan Comments: (  )         Anesthesia Quick Evaluation

## 2024-08-25 NOTE — Transfer of Care (Signed)
 Immediate Anesthesia Transfer of Care Note  Patient: Benjamin Gregory  Procedure(s) Performed: ULTRASOUND, UPPER GI TRACT, ENDOSCOPIC EGD (ESOPHAGOGASTRODUODENOSCOPY)  Patient Location: Endoscopy Unit  Anesthesia Type:General  Level of Consciousness: awake, alert , oriented, and patient cooperative  Airway & Oxygen Therapy: Patient Spontanous Breathing  Post-op Assessment: Report given to RN and Post -op Vital signs reviewed and stable  Post vital signs: Reviewed and stable  Last Vitals:  Vitals Value Taken Time  BP 105/65 08/25/24 16:28  Temp    Pulse 82 08/25/24 16:30  Resp 14 08/25/24 16:30  SpO2 94 % 08/25/24 16:30  Vitals shown include unfiled device data.  Last Pain:  Vitals:   08/25/24 1408  TempSrc: Temporal  PainSc: 0-No pain         Complications: No notable events documented.

## 2024-08-25 NOTE — H&P (Signed)
 GASTROENTEROLOGY PROCEDURE H&P NOTE   Primary Care Physician: de [place], Quintin PARAS, MD  HPI: Benjamin Gregory is a 38 y.o. male who presents for EUS for pancreatic cyst evaluation aspiration vs cystgastrostomy.  Past Medical History:  Diagnosis Date   Gallstones    Pseudocyst of pancreas    Past Surgical History:  Procedure Laterality Date   CHOLECYSTECTOMY N/A 03/29/2019   Procedure: LAPAROSCOPIC CHOLECYSTECTOMY WITH INTRAOPERATIVE CHOLANGIOGRAM;  Surgeon: Rubin Calamity, MD;  Location: [facility];  Service: General;  Laterality: N/A;   No current facility-administered medications for this encounter.   No current facility-administered medications for this encounter. No Known Allergies Family History  Problem Relation Age of Onset   Healthy Mother    Hypertension Father    Healthy Brother    Colon cancer Neg Hx    Esophageal cancer Neg Hx    Inflammatory bowel disease Neg Hx    Liver disease Neg Hx    Pancreatic cancer Neg Hx    Rectal cancer Neg Hx    Stomach cancer Neg Hx    Social History   Socioeconomic History   Marital status: Married    Spouse name: Not on file   Number of children: 0   Years of education: Not on file   Highest education level: Bachelor's degree (e.g., BA, AB, BS)  Occupational History   Occupation: Scientist, water quality  Tobacco Use   Smoking status: Never    Passive exposure: Never   Smokeless tobacco: Never  Vaping Use   Vaping status: Never Used  Substance and Sexual Activity   Alcohol use: Yes    Alcohol/week: 4.0 standard drinks of alcohol   Drug use: Never   Sexual activity: Yes    Birth control/protection: None  Other Topics Concern   Not on file  Social History Narrative   Not on file   Social Drivers of Health   Financial Resource Strain: Low Risk  (07/28/2024)   Overall Financial Resource Strain (CARDIA)    Difficulty of Paying Living Expenses: Not hard at all  Food Insecurity: No Food Insecurity (07/28/2024)   Hunger Vital Sign     Worried About Running Out of Food in the Last Year: Never true    Ran Out of Food in the Last Year: Never true  Transportation Needs: No Transportation Needs (07/28/2024)   PRAPARE - Administrator, Civil Service (Medical): No    Lack of Transportation (Non-Medical): No  Physical Activity: Insufficiently Active (07/28/2024)   Exercise Vital Sign    Days of Exercise per Week: 2 days    Minutes of Exercise per Session: 60 min  Stress: Stress Concern Present (07/28/2024)   [employer] of Occupational Health - Occupational Stress Questionnaire    Feeling of Stress: To some extent  Social Connections: Socially Integrated (07/28/2024)   Social Connection and Isolation Panel    Frequency of Communication with Friends and Family: Once a week    Frequency of Social Gatherings with Friends and Family: More than three times a week    Attends Religious Services: More than 4 times per year    Active Member of [employer] or Organizations: Yes    Attends Engineer, Structural: More than 4 times per year    Marital Status: Married  Catering Manager Violence: Not At Risk (07/29/2023)   Humiliation, Afraid, Rape, and Kick questionnaire    Fear of Current or Ex-Partner: No    Emotionally Abused: No    Physically Abused: No  Sexually Abused: No    Physical Exam: Today's Vitals   08/19/24 1224  Weight: 125 kg   Body mass index is 39.54 kg/m. GEN: NAD EYE: Sclerae anicteric ENT: MMM CV: Non-tachycardic GI: Soft, NT/ND NEURO:  Alert & Oriented x 3  Lab Results: No results for input(s): WBC, HGB, HCT, PLT in the last 72 hours. BMET No results for input(s): NA, K, CL, CO2, GLUCOSE, BUN, CREATININE, CALCIUM in the last 72 hours. LFT No results for input(s): PROT, ALBUMIN, AST, ALT, ALKPHOS, BILITOT, BILIDIR, IBILI in the last 72 hours. PT/INR No results for input(s): LABPROT, INR in the last 72 hours.   Impression /  Plan: This is a 38 y.o.male who presents for EUS for pancreatic cyst evaluation aspiration vs cystgastrostomy.  The risks of an EUS, including intestinal perforation, bleeding, infection, aspiration, and medication effects were discussed.  When a cystgastrostomy/cystenterostomy is performed as part of the EUS, there is an additional risk of pancreatitis at the rate of about 1-2%.  It was explained that procedure related pancreatitis is typically mild, although at times it can be severe and even life threatening.   The risks and benefits of endoscopic evaluation/treatment were discussed with the patient and/or family; these include but are not limited to the risk of perforation, infection, bleeding, missed lesions, lack of diagnosis, severe illness requiring hospitalization, as well as anesthesia and sedation related illnesses.  The patient's history has been reviewed, patient examined, no change in status, and deemed stable for procedure.  The patient and/or family is agreeable to proceed.    Aloha Finner, MD [facility] Office # [phone]

## 2024-08-25 NOTE — Anesthesia Postprocedure Evaluation (Signed)
 Anesthesia Post Note  Patient: Benjamin Gregory  Procedure(s) Performed: ULTRASOUND, UPPER GI TRACT, ENDOSCOPIC EGD (ESOPHAGOGASTRODUODENOSCOPY)     Patient location during evaluation: PACU Anesthesia Type: General Level of consciousness: awake and alert Pain management: pain level controlled Vital Signs Assessment: post-procedure vital signs reviewed and stable Respiratory status: spontaneous breathing, nonlabored ventilation, respiratory function stable and patient connected to nasal cannula oxygen Cardiovascular status: blood pressure returned to baseline and stable Postop Assessment: no apparent nausea or vomiting Anesthetic complications: no   No notable events documented.  Last Vitals:  Vitals:   08/25/24 1655 08/25/24 1700  BP:  (!) 110/43  Pulse: 71 (!) 58  Resp: 13 11  Temp:    SpO2: 97% 97%    Last Pain:  Vitals:   08/25/24 1645  TempSrc:   PainSc: 0-No pain   Pain Goal:                   Davin Muramoto

## 2024-08-25 NOTE — Telephone Encounter (Signed)
-----   Message from [facility] sent at 08/25/2024  4:18 PM EST ----- Regarding: Followup AXIOS Meghana Tullo, This patient needs a CT abdomen with IV and oral contrast in 2 weeks (urgent read). This patient needs EGD with AXIOS stent pull for pseudocyst drainage in 3 to 4 weeks. Fluoroscopy needed and description. This can be a 45-minute EGD. Thanks. GM

## 2024-08-26 ENCOUNTER — Encounter: Payer: Self-pay | Admitting: Gastroenterology

## 2024-08-26 ENCOUNTER — Other Ambulatory Visit: Payer: Self-pay

## 2024-08-26 ENCOUNTER — Encounter (HOSPITAL_COMMUNITY): Payer: Self-pay | Admitting: Gastroenterology

## 2024-08-26 DIAGNOSIS — R109 Unspecified abdominal pain: Secondary | ICD-10-CM

## 2024-08-26 DIAGNOSIS — R1114 Bilious vomiting: Secondary | ICD-10-CM

## 2024-08-26 DIAGNOSIS — K863 Pseudocyst of pancreas: Secondary | ICD-10-CM

## 2024-08-26 NOTE — Telephone Encounter (Signed)
 CT order entered and sent to the schedulers to be done in 2 weeks with urgent read

## 2024-08-26 NOTE — Telephone Encounter (Signed)
 Called and spoke with patient as well this morning. 2 significant episodes of nausea with vomiting and leading to near syncope. He feels better currently. No fevers over night, but when he had the episodes of vomiting he reports feeling chills. No abdominal pain. No chest pain or palpitations.  I asked him to take another 4 mg of Zofran . Discussed, that this could be multiple things, including onset of Pancreatitis or diaphgram irritation from cyst decompression or other complications. Would hope that he stabilizes. If he does then continue to use Zofran  Q8H PRN. If he does not then he needs to come to the ED as outlined. I also offered, for him to just go to the ED currently and he thinks he would like to see how the next few hours go. I am comfortable with this.  Although I am not in the hospital this week/weekend, I am around. I would not advocate for AXIOS removal for at least 1-week to allow fistula formation without preemptive perforation (if we remove the stent too soon).  If he does go to the ED, then he needs labs CBC/CMP/Amylase/Lipase/ESR/CRP/Lactic Acid.  He will need imaging as well at least CXR/KUB, but with the intervention performed, a CT-Abdomen with IV/PO contrast would make sense to ensure no complications and see if pancreatitis changes could be occurring.  I'll have my team reach out to patient by mid-afternoon to see how he has done. If he feels worse then  he will just go to the ED. I'll put my Inpatient GI team on here so they are aware of this patient possibly coming into the hospital for evaluation.  He and wife agree with this plan of action.  Aloha Finner, MD [facility] Office # [phone]

## 2024-08-26 NOTE — Telephone Encounter (Signed)
 The pt had EUS yesterday and this morning developed nausea and vomiting to the point that he has passed out twice. He did vomit while passed out. He is not aware of hitting his head.  He is with his wife and will go to the ED now for eval.  FYI Dr Wilhelmenia

## 2024-08-26 NOTE — Telephone Encounter (Signed)
 EGD has been set up for 09/22/24 at [facility] with GM at 1245 pm  The pt has been advised and all information has been sent to the pt via My Chart and mail.    See alternate My Chart  message for further communication

## 2024-08-29 ENCOUNTER — Other Ambulatory Visit

## 2024-08-29 ENCOUNTER — Ambulatory Visit: Payer: Self-pay | Admitting: Gastroenterology

## 2024-08-29 ENCOUNTER — Ambulatory Visit (INDEPENDENT_AMBULATORY_CARE_PROVIDER_SITE_OTHER)
Admission: RE | Admit: 2024-08-29 | Discharge: 2024-08-29 | Disposition: A | Discharge status: Home / Self Care | Source: Ambulatory Visit | Attending: Gastroenterology | Admitting: Gastroenterology

## 2024-08-29 DIAGNOSIS — J9811 Atelectasis: Secondary | ICD-10-CM | POA: Diagnosis not present

## 2024-08-29 DIAGNOSIS — R109 Unspecified abdominal pain: Secondary | ICD-10-CM

## 2024-08-29 DIAGNOSIS — R1114 Bilious vomiting: Secondary | ICD-10-CM

## 2024-08-29 DIAGNOSIS — R112 Nausea with vomiting, unspecified: Secondary | ICD-10-CM | POA: Diagnosis not present

## 2024-08-29 DIAGNOSIS — R61 Generalized hyperhidrosis: Secondary | ICD-10-CM | POA: Diagnosis not present

## 2024-08-29 DIAGNOSIS — R059 Cough, unspecified: Secondary | ICD-10-CM | POA: Diagnosis not present

## 2024-08-29 DIAGNOSIS — R509 Fever, unspecified: Secondary | ICD-10-CM | POA: Diagnosis not present

## 2024-08-29 LAB — CBC WITH DIFFERENTIAL/PLATELET
Basophils Absolute: 0 K/uL (ref 0.0–0.1)
Basophils Relative: 0.6 % (ref 0.0–3.0)
Eosinophils Absolute: 0.1 K/uL (ref 0.0–0.7)
Eosinophils Relative: 1.1 % (ref 0.0–5.0)
HCT: 38.9 % — ABNORMAL LOW (ref 39.0–52.0)
Hemoglobin: 13.8 g/dL (ref 13.0–17.0)
Lymphocytes Relative: 13.7 % (ref 12.0–46.0)
Lymphs Abs: 1.1 K/uL (ref 0.7–4.0)
MCHC: 35.4 g/dL (ref 30.0–36.0)
MCV: 86 fl (ref 78.0–100.0)
Monocytes Absolute: 0.5 K/uL (ref 0.1–1.0)
Monocytes Relative: 6.5 % (ref 3.0–12.0)
Neutro Abs: 6.3 K/uL (ref 1.4–7.7)
Neutrophils Relative %: 78.1 % — ABNORMAL HIGH (ref 43.0–77.0)
Platelets: 246 K/uL (ref 150.0–400.0)
RBC: 4.52 Mil/uL (ref 4.22–5.81)
RDW: 12.3 % (ref 11.5–15.5)
WBC: 8.1 K/uL (ref 4.0–10.5)

## 2024-08-29 LAB — LIPASE: Lipase: 15 U/L (ref 11.0–59.0)

## 2024-08-29 LAB — AMYLASE: Amylase: 16 U/L — ABNORMAL LOW (ref 27–131)

## 2024-08-29 LAB — COMPREHENSIVE METABOLIC PANEL WITH GFR
ALT: 61 U/L — ABNORMAL HIGH (ref 0–53)
AST: 56 U/L — ABNORMAL HIGH (ref 0–37)
Albumin: 4.1 g/dL (ref 3.5–5.2)
Alkaline Phosphatase: 104 U/L (ref 39–117)
BUN: 14 mg/dL (ref 6–23)
CO2: 29 meq/L (ref 19–32)
Calcium: 9.3 mg/dL (ref 8.4–10.5)
Chloride: 96 meq/L (ref 96–112)
Creatinine, Ser: 1.03 mg/dL (ref 0.40–1.50)
GFR: 93.66 mL/min (ref >60.00)
Glucose, Bld: 107 mg/dL — ABNORMAL HIGH (ref 70–99)
Potassium: 3.9 meq/L (ref 3.5–5.1)
Sodium: 134 meq/L — ABNORMAL LOW (ref 135–145)
Total Bilirubin: 1.5 mg/dL — ABNORMAL HIGH (ref 0.2–1.2)
Total Protein: 7.9 g/dL (ref 6.0–8.3)

## 2024-08-29 LAB — SURGICAL PATHOLOGY

## 2024-08-29 LAB — HIGH SENSITIVITY CRP: CRP, High Sensitivity: 185.52 mg/L — ABNORMAL HIGH (ref 0.000–5.000)

## 2024-08-29 NOTE — Addendum Note (Signed)
 Addended by: ANITRA ODETTA CROME on: 08/29/2024 12:08 PM   Modules accepted: Orders

## 2024-08-29 NOTE — Telephone Encounter (Signed)
 Orders have been entered

## 2024-08-29 NOTE — Telephone Encounter (Signed)
 Called and spoke with patient today as he did not come into the hospital last week/weekend. Feels somewhat better but systemic symptoms still persisting with nightsweats per report. Toleated PO diet over weekend but was more liquid until this AM. Still having some discomfort in upper abdomen but certainly improved per his report.  I would like the patient to come in for labs and Xray imaging to rule out further complications.  Will have my team place orders for STAT CBC/CMP/Amylase/Lipase/CRP KUB 2-view & CXR PA/Lateral  Still wanting the CT imaging in 10-14 days from EUS, but if labs are abnormal, we may try to get sooner.  Patient agrees to this plan of action.   Aloha Finner, MD [facility] # [phone]

## 2024-08-30 ENCOUNTER — Encounter (HOSPITAL_BASED_OUTPATIENT_CLINIC_OR_DEPARTMENT_OTHER): Payer: Self-pay

## 2024-08-30 NOTE — Telephone Encounter (Signed)
 Glad to hear he is doing better and able to eat. I am OK to keep the current scheduled CT scan for 10-14 days (from the procedure date). We will be repeating the labs in 1-week, so we will see what the inflammatory marker is at that time. No other action needs to be pursued at this time. Further recommendations after the repeat labs, and the CT scan are completed. Thanks. GM

## 2024-08-31 ENCOUNTER — Ambulatory Visit (HOSPITAL_BASED_OUTPATIENT_CLINIC_OR_DEPARTMENT_OTHER)
Admission: RE | Admit: 2024-08-31 | Discharge: 2024-08-31 | Disposition: A | Discharge status: Home / Self Care | Source: Ambulatory Visit | Attending: Gastroenterology | Admitting: Gastroenterology

## 2024-08-31 DIAGNOSIS — K862 Cyst of pancreas: Secondary | ICD-10-CM | POA: Diagnosis not present

## 2024-08-31 DIAGNOSIS — K863 Pseudocyst of pancreas: Secondary | ICD-10-CM | POA: Diagnosis present

## 2024-08-31 MED ORDER — IOHEXOL 300 MG/ML  SOLN
100.0000 mL | Freq: Once | INTRAMUSCULAR | Status: AC | PRN
  Administered 2024-08-31: 100 mL via INTRAVENOUS

## 2024-09-02 ENCOUNTER — Ambulatory Visit (HOSPITAL_COMMUNITY)

## 2024-09-02 ENCOUNTER — Ambulatory Visit: Payer: Self-pay | Admitting: Gastroenterology

## 2024-09-13 ENCOUNTER — Telehealth: Payer: Self-pay | Admitting: Gastroenterology

## 2024-09-13 NOTE — Telephone Encounter (Addendum)
 Procedure:Endoscopy Procedure date: 09/22/24 Procedure location: [facility] Arrival Time: 11:15 am Spoke with the patient Y/N: Yes Any prep concerns? No  Has the patient obtained the prep from the pharmacy ? No prep needed Do you have a care partner and transportation: Yes Any additional concerns? No

## 2024-09-14 NOTE — Progress Notes (Signed)
 Anesthesia Review:  PCP: Cardiologist :  PPM/ ICD: Device Orders: Rep Notified:  Chest x-ray :08/31/24 2 view  EKG : Echo : Stress test: Cardiac Cath :   Activity level:  Sleep Study/ CPAP : Fasting Blood Sugar :      / Checks Blood Sugar -- times a day:    Blood Thinner/ Instructions /Last Dose: ASA / Instructions/ Last Dose :    08/25/24-endo  08/29/24- cbc and cmp   09/14/24- Attempted to reach pt at 1130am.  Unable to leave voice mail.

## 2024-09-22 ENCOUNTER — Telehealth: Payer: Self-pay

## 2024-09-22 ENCOUNTER — Encounter (HOSPITAL_COMMUNITY): Admission: RE | Disposition: A | Payer: Self-pay | Source: Home / Self Care | Attending: Gastroenterology

## 2024-09-22 ENCOUNTER — Other Ambulatory Visit: Payer: Self-pay

## 2024-09-22 ENCOUNTER — Ambulatory Visit (HOSPITAL_COMMUNITY): Admitting: Anesthesiology

## 2024-09-22 ENCOUNTER — Encounter (HOSPITAL_COMMUNITY): Payer: Self-pay | Admitting: Gastroenterology

## 2024-09-22 ENCOUNTER — Ambulatory Visit (HOSPITAL_COMMUNITY)
Admission: RE | Admit: 2024-09-22 | Discharge: 2024-09-22 | Disposition: A | Attending: Gastroenterology | Admitting: Gastroenterology

## 2024-09-22 DIAGNOSIS — K3189 Other diseases of stomach and duodenum: Secondary | ICD-10-CM | POA: Diagnosis not present

## 2024-09-22 DIAGNOSIS — T182XXA Foreign body in stomach, initial encounter: Secondary | ICD-10-CM

## 2024-09-22 DIAGNOSIS — K863 Pseudocyst of pancreas: Secondary | ICD-10-CM

## 2024-09-22 DIAGNOSIS — R110 Nausea: Secondary | ICD-10-CM

## 2024-09-22 DIAGNOSIS — Z9689 Presence of other specified functional implants: Secondary | ICD-10-CM | POA: Diagnosis not present

## 2024-09-22 DIAGNOSIS — K8689 Other specified diseases of pancreas: Secondary | ICD-10-CM

## 2024-09-22 DIAGNOSIS — W449XXA Unspecified foreign body entering into or through a natural orifice, initial encounter: Secondary | ICD-10-CM | POA: Diagnosis not present

## 2024-09-22 DIAGNOSIS — K862 Cyst of pancreas: Secondary | ICD-10-CM | POA: Diagnosis not present

## 2024-09-22 DIAGNOSIS — Z978 Presence of other specified devices: Secondary | ICD-10-CM | POA: Diagnosis not present

## 2024-09-22 HISTORY — PX: ESOPHAGOGASTRODUODENOSCOPY: SHX5428

## 2024-09-22 SURGERY — EGD (ESOPHAGOGASTRODUODENOSCOPY)
Anesthesia: Monitor Anesthesia Care

## 2024-09-22 MED ORDER — LIDOCAINE 2% (20 MG/ML) 5 ML SYRINGE
INTRAMUSCULAR | Status: DC | PRN
  Administered 2024-09-22: 80 mg via INTRAVENOUS

## 2024-09-22 MED ORDER — PANTOPRAZOLE SODIUM 20 MG PO TBEC: 20.0000 mg | DELAYED_RELEASE_TABLET | Freq: Every day | ORAL | 0 refills | Status: AC

## 2024-09-22 MED ORDER — CIPROFLOXACIN IN D5W 400 MG/200ML IV SOLN
INTRAVENOUS | Status: AC
  Filled 2024-09-22: qty 200

## 2024-09-22 MED ORDER — PROPOFOL 500 MG/50ML IV EMUL
INTRAVENOUS | Status: DC | PRN
  Administered 2024-09-22: 150 ug/kg/min via INTRAVENOUS

## 2024-09-22 MED ORDER — PROPOFOL 10 MG/ML IV BOLUS
INTRAVENOUS | Status: AC
  Filled 2024-09-22: qty 20

## 2024-09-22 MED ORDER — CIPROFLOXACIN IN D5W 400 MG/200ML IV SOLN
INTRAVENOUS | Status: DC | PRN
  Administered 2024-09-22: 400 mg via INTRAVENOUS

## 2024-09-22 MED ORDER — ONDANSETRON 4 MG PO TBDP: 4.0000 mg | ORAL_TABLET | Freq: Three times a day (TID) | ORAL | 1 refills | Status: AC | PRN

## 2024-09-22 MED ORDER — SODIUM CHLORIDE 0.9 % IV SOLN: INTRAVENOUS | Status: DC

## 2024-09-22 MED ORDER — ONDANSETRON HCL 4 MG/2ML IJ SOLN
INTRAMUSCULAR | Status: DC | PRN
  Administered 2024-09-22: 4 mg via INTRAVENOUS

## 2024-09-22 MED ORDER — SODIUM CHLORIDE 0.9 % IV SOLN
INTRAVENOUS | Status: AC | PRN
  Administered 2024-09-22: 500 mL via INTRAMUSCULAR

## 2024-09-22 NOTE — Anesthesia Preprocedure Evaluation (Addendum)
 Anesthesia Evaluation  Patient identified by MRN, date of birth, ID band Patient awake    Reviewed: Allergy & Precautions, H&P , NPO status , Patient's Chart, lab work & pertinent test results  Airway Mallampati: II  TM Distance: >3 FB Neck ROM: Full    Dental  (+) Dental Advisory Given   Pulmonary neg pulmonary ROS   Pulmonary exam normal breath sounds clear to auscultation       Cardiovascular Exercise Tolerance: Good negative cardio ROS Normal cardiovascular exam Rhythm:Regular Rate:Normal     Neuro/Psych negative neurological ROS  negative psych ROS   GI/Hepatic negative GI ROS, Neg liver ROS,,,  Endo/Other  negative endocrine ROS    Renal/GU negative Renal ROS  negative genitourinary   Musculoskeletal negative musculoskeletal ROS (+)    Abdominal  (+) + obese  Peds  Hematology negative hematology ROS (+)   Anesthesia Other Findings   Reproductive/Obstetrics negative OB ROS                              Anesthesia Physical Anesthesia Plan  ASA: 2  Anesthesia Plan: MAC   Post-op Pain Management: Minimal or no pain anticipated   Induction:   PONV Risk Score and Plan: 4 or greater and Ondansetron  and Propofol  infusion  Airway Management Planned: Natural Airway and Nasal Cannula  Additional Equipment: None  Intra-op Plan:   Post-operative Plan:   Informed Consent: I have reviewed the patients History and Physical, chart, labs and discussed the procedure including the risks, benefits and alternatives for the proposed anesthesia with the patient or authorized representative who has indicated his/her understanding and acceptance.     Dental advisory given  Plan Discussed with: CRNA and Anesthesiologist  Anesthesia Plan Comments: (  )         Anesthesia Quick Evaluation

## 2024-09-22 NOTE — Discharge Instructions (Signed)

## 2024-09-22 NOTE — Telephone Encounter (Signed)
-----   Message from [facility] sent at 09/22/2024 12:49 PM EST ----- Regarding: Follow-up Benjamin Gregory, We removed this patient's AXIOS pseudocyst stent today and completed necrosectomy. Please schedule patient for CT abdomen pancreas protocol in 6 to 10 weeks. Follow-up in clinic with me after CT scan has been completed. Thanks. GM

## 2024-09-22 NOTE — Transfer of Care (Signed)
 Immediate Anesthesia Transfer of Care Note  Patient: Benjamin Gregory  Procedure(s) Performed: EGD (ESOPHAGOGASTRODUODENOSCOPY)  Patient Location: PACU  Anesthesia Type:MAC  Level of Consciousness: awake and alert   Airway & Oxygen Therapy: Patient Spontanous Breathing and Patient connected to nasal cannula oxygen  Post-op Assessment: Report given to RN and Post -op Vital signs reviewed and stable  Post vital signs: Reviewed and stable  Last Vitals:  Vitals Value Taken Time  BP 102/62 09/22/24 12:44  Temp 36.7 C 09/22/24 12:44  Pulse 78 09/22/24 12:45  Resp 16 09/22/24 12:45  SpO2 94 % 09/22/24 12:45  Vitals shown include unfiled device data.  Last Pain:  Vitals:   09/22/24 1244  TempSrc: Temporal  PainSc: 0-No pain         Complications: No notable events documented.

## 2024-09-22 NOTE — Anesthesia Postprocedure Evaluation (Signed)
 Anesthesia Post Note  Patient: Benjamin Gregory  Procedure(s) Performed: EGD (ESOPHAGOGASTRODUODENOSCOPY)     Patient location during evaluation: PACU Anesthesia Type: MAC Level of consciousness: awake and alert Pain management: pain level controlled Vital Signs Assessment: post-procedure vital signs reviewed and stable Respiratory status: spontaneous breathing, nonlabored ventilation, respiratory function stable and patient connected to nasal cannula oxygen Cardiovascular status: stable and blood pressure returned to baseline Postop Assessment: no apparent nausea or vomiting Anesthetic complications: no   No notable events documented.  Last Vitals:  Vitals:   09/22/24 1250 09/22/24 1300  BP: 101/69 123/76  Pulse: 74 70  Resp: 11 13  Temp:    SpO2: 96% 96%    Last Pain:  Vitals:   09/22/24 1300  TempSrc:   PainSc: 4                  Epifanio Lamar BRAVO

## 2024-09-22 NOTE — Op Note (Signed)
 [facility] Patient Name: Benjamin Gregory Procedure Date: 09/22/2024 MRN: [id number] Attending MD: Aloha Finner , MD, [id number] Date of Birth: 1986-03-06 CSN: [id number] Age: 38 Admit Type: Outpatient Procedure:                Upper GI endoscopy Indications:              Foreign body in the stomach, Pancreatic pseudocyst Providers:                Aloha Finner, MD, Robie Breed, RN,                            Corene Southgate, Technician Referring MD:              Medicines:                Monitored Anesthesia Care, Cipro  400 mg IV Complications:            No immediate complications. Estimated Blood Loss:     Estimated blood loss was minimal. Procedure:                Pre-Anesthesia Assessment:                           - Prior to the procedure, a History and Physical                            was performed, and patient medications and                            allergies were reviewed. The patient's tolerance of                            previous anesthesia was also reviewed. The risks                            and benefits of the procedure and the sedation                            options and risks were discussed with the patient.                            All questions were answered, and informed consent                            was obtained. Prior Anticoagulants: The patient has                            taken no anticoagulant or antiplatelet agents. ASA                            Grade Assessment: II - A patient with mild systemic                            disease. After reviewing the risks and benefits,  the patient was deemed in satisfactory condition to                            undergo the procedure.                           After obtaining informed consent, the endoscope was                            passed under direct vision. Throughout the                            procedure, the patient's blood pressure,  pulse, and                            oxygen saturations were monitored continuously. The                            [id number] ([id number]) Olympus endoscope was                            introduced through the mouth, and advanced to the                            second part of duodenum. The upper GI endoscopy was                            accomplished without difficulty. The patient                            tolerated the procedure. Scope In: Scope Out: Findings:      No gross lesions were noted in the entire esophagus.      A previously placed AXIOS and plastic double pigtail cystgastrostomy       stents were found in the gastric body. The two stents were removed from       the cystgastrostomy with a Raptor grasping device. The cystgastrostomy       tract was intubated into the pseudocyst cavity under direct vision. The       cyst was partially filled with fluid and black necrotic tissue that was       pasty and adherent to the cyst wall. Necrosectomy was performed with a       Raptor grasping device and suction and lavage, requiring several       intubations of the cyst. At the conclusion of the procedure, no necrotic       tissue and a large amount of pink, viable tissue was found within the       pseudocyst on direct vision. Decison made to no replace any stents.      Patchy mildly erythematous mucosa without bleeding was found in the       entire examined stomach. Previously biopsied and negative for HP so not       redone.      No gross lesions were noted in the duodenal bulb, in the first portion       of the duodenum and in the second portion of the duodenum.  Impression:               - No gross lesions in the entire esophagus.                           - Pre-existing Cystgastrostomy stents noted. These                            were removed. Pseudocyst was intubated. Necrosis                            was noted. Necrosectomy performed as above.                             Pseudocyst cavity at completion of procedure                            healthy.                           - Erythematous mucosa in the stomach.                           - No gross lesions in the duodenal bulb, in the                            first portion of the duodenum and in the second                            portion of the duodenum. Moderate Sedation:      Not Applicable - Patient had care per Anesthesia. Recommendation:           - The patient will be observed post-procedure,                            until all discharge criteria are met.                           - Discharge patient to home.                           - Patient has a contact number available for                            emergencies. The signs and symptoms of potential                            delayed complications were discussed with the                            patient. Return to normal activities tomorrow.                            Written discharge instructions were provided to the  patient.                           - Full liquid diet.                           - Protonix  20 mg daily for 2-weeks then may stop.                           - Continue present medications.                           - CT-Abdomen Pancreas Protocol in 6-10 weeks.                           - The findings and recommendations were discussed                            with the patient.                           - The findings and recommendations were discussed                            with the patient's family. Procedure Code(s):        --- Professional ---                           [phone], Esophagogastroduodenoscopy, flexible,                            transoral; diagnostic, including collection of                            specimen(s) by brushing or washing, when performed                            (separate procedure)                           [phone], Unlisted procedure, pancreas Diagnosis  Code(s):        --- Professional ---                           Z97.8, Presence of other specified devices                           K31.89, Other diseases of stomach and duodenum                           T18.2XXA, Foreign body in stomach, initial encounter                           K86.3, Pseudocyst of pancreas CPT copyright 2022 American Medical Association. All rights reserved. The codes documented in this report are preliminary and upon coder review may  be revised to meet current compliance requirements. Aloha Finner, MD 09/22/2024 12:47:25 PM  Number of Addenda: 0

## 2024-09-22 NOTE — H&P (Signed)
 GASTROENTEROLOGY PROCEDURE H&P NOTE   Primary Care Physician: de [place], Quintin PARAS, MD  HPI: Benjamin Gregory is a 38 y.o. male who presents for EGD for stent removal AXIOS cystgastrostomy pending all is well now nearly 4 weeks after placement for pseudocyst.  Past Medical History:  Diagnosis Date   Gallstones    Pseudocyst of pancreas    Past Surgical History:  Procedure Laterality Date   CHOLECYSTECTOMY N/A 03/29/2019   Procedure: LAPAROSCOPIC CHOLECYSTECTOMY WITH INTRAOPERATIVE CHOLANGIOGRAM;  Surgeon: Rubin Calamity, MD;  Location: [facility];  Service: General;  Laterality: N/A;   ESOPHAGOGASTRODUODENOSCOPY N/A 08/25/2024   Procedure: EGD (ESOPHAGOGASTRODUODENOSCOPY);  Surgeon: Wilhelmenia Aloha Raddle., MD;  Location: [facility];  Service: Gastroenterology;  Laterality: N/A;   EUS N/A 08/25/2024   Procedure: ULTRASOUND, UPPER GI TRACT, ENDOSCOPIC;  Surgeon: Wilhelmenia Aloha Raddle., MD;  Location: [facility];  Service: Gastroenterology;  Laterality: N/A;   Current Facility-Administered Medications  Medication Dose Route Frequency Provider Last Rate Last Admin   0.9 %  sodium chloride  infusion   Intravenous Continuous Mansouraty, Aloha Raddle., MD       0.9 %  sodium chloride  infusion    Continuous PRN Mansouraty, Aloha Raddle., MD 10 mL/hr at 09/22/24 1154 500 mL at 09/22/24 1154    Current Facility-Administered Medications:    0.9 %  sodium chloride  infusion, , Intravenous, Continuous, Mansouraty, Aloha Raddle., MD   0.9 %  sodium chloride  infusion, , , Continuous PRN, Mansouraty, Aloha Raddle., MD, Last Rate: 10 mL/hr at 09/22/24 1154, 500 mL at 09/22/24 1154 No Known Allergies Family History  Problem Relation Age of Onset   Healthy Mother    Hypertension Father    Healthy Brother    Colon cancer Neg Hx    Esophageal cancer Neg Hx    Inflammatory bowel disease Neg Hx    Liver disease Neg Hx    Pancreatic cancer Neg Hx    Rectal cancer Neg Hx    Stomach cancer Neg Hx    Social  History   Socioeconomic History   Marital status: Married    Spouse name: Not on file   Number of children: 0   Years of education: Not on file   Highest education level: Bachelor's degree (e.g., BA, AB, BS)  Occupational History   Occupation: Scientist, water quality  Tobacco Use   Smoking status: Never    Passive exposure: Never   Smokeless tobacco: Never  Vaping Use   Vaping status: Never Used  Substance and Sexual Activity   Alcohol use: Yes    Alcohol/week: 4.0 standard drinks of alcohol   Drug use: Never   Sexual activity: Yes    Birth control/protection: None  Other Topics Concern   Not on file  Social History Narrative   Not on file   Social Drivers of Health   Financial Resource Strain: Low Risk  (07/28/2024)   Overall Financial Resource Strain (CARDIA)    Difficulty of Paying Living Expenses: Not hard at all  Food Insecurity: No Food Insecurity (07/28/2024)   Hunger Vital Sign    Worried About Running Out of Food in the Last Year: Never true    Ran Out of Food in the Last Year: Never true  Transportation Needs: No Transportation Needs (07/28/2024)   PRAPARE - Administrator, Civil Service (Medical): No    Lack of Transportation (Non-Medical): No  Physical Activity: Insufficiently Active (07/28/2024)   Exercise Vital Sign    Days of Exercise per Week: 2  days    Minutes of Exercise per Session: 60 min  Stress: Stress Concern Present (07/28/2024)   [employer] of Occupational Health - Occupational Stress Questionnaire    Feeling of Stress: To some extent  Social Connections: Socially Integrated (07/28/2024)   Social Connection and Isolation Panel    Frequency of Communication with Friends and Family: Once a week    Frequency of Social Gatherings with Friends and Family: More than three times a week    Attends Religious Services: More than 4 times per year    Active Member of [employer] or Organizations: Yes    Attends Banker Meetings: More than 4  times per year    Marital Status: Married  Catering Manager Violence: Not At Risk (07/29/2023)   Humiliation, Afraid, Rape, and Kick questionnaire    Fear of Current or Ex-Partner: No    Emotionally Abused: No    Physically Abused: No    Sexually Abused: No    Physical Exam: Today's Vitals   09/22/24 1151  BP: 119/77  Pulse: 70  Resp: 12  Temp: 98.2 F (36.8 C)  TempSrc: Temporal  SpO2: 100%  Weight: 125 kg  Height: 5' 10 (1.778 m)  PainSc: 0-No pain   Body mass index is 39.54 kg/m. GEN: NAD EYE: Sclerae anicteric ENT: MMM CV: Non-tachycardic GI: Soft, NT/ND NEURO:  Alert & Oriented x 3  Lab Results: No results for input(s): WBC, HGB, HCT, PLT in the last 72 hours. BMET No results for input(s): NA, K, CL, CO2, GLUCOSE, BUN, CREATININE, CALCIUM in the last 72 hours. LFT No results for input(s): PROT, ALBUMIN, AST, ALT, ALKPHOS, BILITOT, BILIDIR, IBILI in the last 72 hours. PT/INR No results for input(s): LABPROT, INR in the last 72 hours.   Impression / Plan: This is a 38 y.o.male who presents for EGD for stent removal AXIOS cystgastrostomy pending all is well now nearly 4 weeks after placement for pseudocyst.  The risks and benefits of endoscopic evaluation/treatment were discussed with the patient and/or family; these include but are not limited to the risk of perforation, infection, bleeding, missed lesions, lack of diagnosis, severe illness requiring hospitalization, as well as anesthesia and sedation related illnesses.  The patient's history has been reviewed, patient examined, no change in status, and deemed stable for procedure.  The patient and/or family was provided an opportunity to ask questions and all were answered.  The patient and/or family is agreeable to proceed.    Aloha Finner, MD [facility] Office # [phone]

## 2024-09-23 ENCOUNTER — Encounter (HOSPITAL_COMMUNITY): Payer: Self-pay | Admitting: Gastroenterology

## 2024-11-03 NOTE — Telephone Encounter (Signed)
CT order has been entered and sent to the schedulers

## 2024-11-11 ENCOUNTER — Ambulatory Visit (HOSPITAL_COMMUNITY)
Admission: RE | Admit: 2024-11-11 | Discharge: 2024-11-11 | Disposition: A | Discharge status: Home / Self Care | Source: Ambulatory Visit | Attending: Gastroenterology | Admitting: Gastroenterology

## 2024-11-11 DIAGNOSIS — K863 Pseudocyst of pancreas: Secondary | ICD-10-CM | POA: Diagnosis present

## 2024-11-11 MED ORDER — IOHEXOL 300 MG/ML  SOLN
100.0000 mL | Freq: Once | INTRAMUSCULAR | Status: AC | PRN
  Administered 2024-11-11: 100 mL via INTRAVENOUS

## 2024-11-14 ENCOUNTER — Ambulatory Visit: Payer: Self-pay | Admitting: Gastroenterology

## 2024-12-16 ENCOUNTER — Ambulatory Visit: Admitting: Gastroenterology

## 2025-07-31 ENCOUNTER — Encounter (HOSPITAL_BASED_OUTPATIENT_CLINIC_OR_DEPARTMENT_OTHER): Admitting: Family Medicine
